# Patient Record
Sex: Female | Born: 1990 | Race: Black or African American | Hispanic: No | Marital: Single | Smoking: Never smoker
Health system: Southern US, Community
[De-identification: ages and names within clinical notes are randomized; demographics above are authoritative.]

## PROBLEM LIST (undated history)

## (undated) ENCOUNTER — Inpatient Hospital Stay: Payer: Self-pay

## (undated) DIAGNOSIS — D696 Thrombocytopenia, unspecified: Secondary | ICD-10-CM

## (undated) DIAGNOSIS — R87629 Unspecified abnormal cytological findings in specimens from vagina: Secondary | ICD-10-CM

## (undated) HISTORY — DX: Unspecified abnormal cytological findings in specimens from vagina: R87.629

## (undated) HISTORY — PX: OTHER SURGICAL HISTORY: SHX169

---
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
## 2012-12-13 ENCOUNTER — Observation Stay: Payer: Self-pay

## 2012-12-22 ENCOUNTER — Observation Stay: Payer: Self-pay | Admitting: Obstetrics and Gynecology

## 2012-12-25 ENCOUNTER — Inpatient Hospital Stay: Payer: Self-pay

## 2012-12-25 LAB — CBC WITH DIFFERENTIAL/PLATELET
Basophil #: 0.1 10*3/uL (ref 0.0–0.1)
Eosinophil #: 0 10*3/uL (ref 0.0–0.7)
Eosinophil %: 0.3 %
HGB: 13 g/dL (ref 12.0–16.0)
Lymphocyte %: 17.9 %
MCV: 87 fL (ref 80–100)
Monocyte #: 0.7 x10 3/mm (ref 0.2–0.9)
Monocyte %: 7.7 %
Neutrophil #: 7.2 10*3/uL — ABNORMAL HIGH (ref 1.4–6.5)
RBC: 4.48 10*6/uL (ref 3.80–5.20)
WBC: 9.8 10*3/uL (ref 3.6–11.0)

## 2012-12-26 LAB — COMPREHENSIVE METABOLIC PANEL
Anion Gap: 7 (ref 7–16)
BUN: 8 mg/dL (ref 7–18)
Chloride: 110 mmol/L — ABNORMAL HIGH (ref 98–107)
Co2: 22 mmol/L (ref 21–32)
Glucose: 68 mg/dL (ref 65–99)
Osmolality: 274 (ref 275–301)
SGPT (ALT): 13 U/L (ref 12–78)
Total Protein: 5.3 g/dL — ABNORMAL LOW (ref 6.4–8.2)

## 2012-12-26 LAB — CBC
HCT: 33.6 % — ABNORMAL LOW (ref 35.0–47.0)
HGB: 11.2 g/dL — ABNORMAL LOW (ref 12.0–16.0)
MCH: 28.8 pg (ref 26.0–34.0)
MCHC: 33.3 g/dL (ref 32.0–36.0)
MCV: 86 fL (ref 80–100)
Platelet: 55 10*3/uL — ABNORMAL LOW (ref 150–440)
RDW: 13.3 % (ref 11.5–14.5)
WBC: 13.6 10*3/uL — ABNORMAL HIGH (ref 3.6–11.0)

## 2012-12-26 LAB — LACTATE DEHYDROGENASE: LDH: 194 U/L (ref 81–246)

## 2012-12-27 LAB — CBC
HCT: 33.3 % — ABNORMAL LOW (ref 35.0–47.0)
RBC: 3.78 10*6/uL — ABNORMAL LOW (ref 3.80–5.20)
WBC: 11.5 10*3/uL — ABNORMAL HIGH (ref 3.6–11.0)

## 2013-06-24 ENCOUNTER — Ambulatory Visit: Payer: Self-pay | Admitting: Hematology and Oncology

## 2013-08-24 ENCOUNTER — Emergency Department: Payer: Self-pay | Admitting: Emergency Medicine

## 2013-08-24 LAB — URINALYSIS, COMPLETE
BILIRUBIN, UR: NEGATIVE
Blood: NEGATIVE
Glucose,UR: NEGATIVE mg/dL (ref 0–75)
Ketone: NEGATIVE
Nitrite: NEGATIVE
PROTEIN: NEGATIVE
Ph: 6 (ref 4.5–8.0)
RBC,UR: 2 /HPF (ref 0–5)
Specific Gravity: 1.029 (ref 1.003–1.030)
Squamous Epithelial: 7
WBC UR: 34 /HPF (ref 0–5)

## 2013-08-24 LAB — CBC WITH DIFFERENTIAL/PLATELET
Basophil #: 0 10*3/uL (ref 0.0–0.1)
Basophil %: 0.6 %
EOS PCT: 1.2 %
Eosinophil #: 0.1 10*3/uL (ref 0.0–0.7)
HCT: 37.5 % (ref 35.0–47.0)
HGB: 11.9 g/dL — AB (ref 12.0–16.0)
LYMPHS ABS: 3.2 10*3/uL (ref 1.0–3.6)
Lymphocyte %: 43.3 %
MCH: 26.6 pg (ref 26.0–34.0)
MCHC: 31.6 g/dL — ABNORMAL LOW (ref 32.0–36.0)
MCV: 84 fL (ref 80–100)
Monocyte #: 0.6 x10 3/mm (ref 0.2–0.9)
Monocyte %: 8.7 %
Neutrophil #: 3.4 10*3/uL (ref 1.4–6.5)
Neutrophil %: 46.2 %
PLATELETS: 144 10*3/uL — AB (ref 150–440)
RBC: 4.46 10*6/uL (ref 3.80–5.20)
RDW: 14.1 % (ref 11.5–14.5)
WBC: 7.4 10*3/uL (ref 3.6–11.0)

## 2013-08-24 LAB — COMPREHENSIVE METABOLIC PANEL
ALBUMIN: 4.1 g/dL (ref 3.4–5.0)
ANION GAP: 6 — AB (ref 7–16)
Alkaline Phosphatase: 74 U/L
BILIRUBIN TOTAL: 0.3 mg/dL (ref 0.2–1.0)
BUN: 13 mg/dL (ref 7–18)
CO2: 27 mmol/L (ref 21–32)
Calcium, Total: 9.1 mg/dL (ref 8.5–10.1)
Chloride: 106 mmol/L (ref 98–107)
Creatinine: 0.77 mg/dL (ref 0.60–1.30)
EGFR (African American): >60
Glucose: 100 mg/dL — ABNORMAL HIGH (ref 65–99)
Osmolality: 278 (ref 275–301)
POTASSIUM: 3.9 mmol/L (ref 3.5–5.1)
SGOT(AST): 13 U/L — ABNORMAL LOW (ref 15–37)
SGPT (ALT): 13 U/L (ref 12–78)
Sodium: 139 mmol/L (ref 136–145)
Total Protein: 8.1 g/dL (ref 6.4–8.2)

## 2013-08-24 LAB — LIPASE, BLOOD: LIPASE: 263 U/L (ref 73–393)

## 2013-08-26 LAB — URINE CULTURE

## 2014-04-08 NOTE — L&D Delivery Note (Signed)
Delivery Note At 11:09 PM a viable female was delivered via Vaginal, Spontaneous Delivery (Presentation: Left Occiput Anterior).  APGAR: 8,9 ; weight - pending.   Placenta status: Intact, Spontaneous.  Cord: 3 vessels with the following complications: none apparent .  Cord pH: n/a  Anesthesia: Pudendal block (10cc 1% lidocaine) Episiotomy: None Lacerations: None Suture Repair: n/a Est. Blood Loss (mL): 50cc  Mom to postpartum.  Baby to Couplet care / Skin to Skin.  Patient progressed from 5 to complete fairly quickly and had a short second stage with 2 pushes.  No lacerations.  Baby was delivered and placed on mom's chest.  Cord was kept patent until pulseless and then doubly clamped and cut by FOB.  Placenta was delivered intact, spontaneously and with minimal blood loss.  Methergine PO was given with attempts to minimize post partum bleeding, in addition to pitocin bolus (patient had been on pitocin without a break for >12 hours).  Baby did have a ball of tissue attached by a thin bridge of skin to the distal digit (pinky) of left hand.  No other abnormalities noted on brief and gross exam.  After cord was cut baby was placed on mom for skin-to-skin and we sang happy birthday.  Ward, Chelsea C 02/07/2015, 11:27 PM

## 2014-05-09 ENCOUNTER — Emergency Department: Payer: Self-pay | Admitting: Emergency Medicine

## 2014-05-09 LAB — CBC
HCT: 38.5 % (ref 35.0–47.0)
HGB: 12.3 g/dL (ref 12.0–16.0)
MCH: 27 pg (ref 26.0–34.0)
MCHC: 32 g/dL (ref 32.0–36.0)
MCV: 84 fL (ref 80–100)
Platelet: 162 10*3/uL (ref 150–440)
RBC: 4.56 10*6/uL (ref 3.80–5.20)
RDW: 13.4 % (ref 11.5–14.5)
WBC: 8.8 10*3/uL (ref 3.6–11.0)

## 2014-05-09 LAB — URINALYSIS, COMPLETE
Bacteria: NONE SEEN
Bilirubin,UR: NEGATIVE
Blood: NEGATIVE
Glucose,UR: NEGATIVE mg/dL (ref 0–75)
Ketone: NEGATIVE
NITRITE: NEGATIVE
PH: 5 (ref 4.5–8.0)
PROTEIN: NEGATIVE
RBC,UR: 1 /HPF (ref 0–5)
Specific Gravity: 1.029 (ref 1.003–1.030)
Squamous Epithelial: 4
WBC UR: 4 /HPF (ref 0–5)

## 2014-05-09 LAB — LIPASE, BLOOD: Lipase: 139 U/L (ref 73–393)

## 2014-05-09 LAB — COMPREHENSIVE METABOLIC PANEL
Albumin: 3.7 g/dL (ref 3.4–5.0)
Alkaline Phosphatase: 73 U/L (ref 46–116)
Anion Gap: 7 (ref 7–16)
BUN: 13 mg/dL (ref 7–18)
Bilirubin,Total: 0.4 mg/dL (ref 0.2–1.0)
Calcium, Total: 9.1 mg/dL (ref 8.5–10.1)
Chloride: 107 mmol/L (ref 98–107)
Co2: 27 mmol/L (ref 21–32)
Creatinine: 0.85 mg/dL (ref 0.60–1.30)
EGFR (African American): >60
EGFR (Non-African Amer.): >60
Glucose: 83 mg/dL (ref 65–99)
Osmolality: 281 (ref 275–301)
Potassium: 4 mmol/L (ref 3.5–5.1)
SGOT(AST): 21 U/L (ref 15–37)
SGPT (ALT): 15 U/L (ref 14–63)
Sodium: 141 mmol/L (ref 136–145)
Total Protein: 7.9 g/dL (ref 6.4–8.2)

## 2014-07-10 ENCOUNTER — Emergency Department
Admit: 2014-07-10 | Disposition: A | Discharge status: Home / Self Care | Payer: Self-pay | Admitting: Emergency Medicine

## 2014-07-25 DIAGNOSIS — E663 Overweight: Secondary | ICD-10-CM | POA: Insufficient documentation

## 2014-07-26 LAB — OB RESULTS CONSOLE RPR: RPR: NONREACTIVE

## 2014-07-26 LAB — OB RESULTS CONSOLE ABO/RH: RH Type: POSITIVE

## 2014-07-26 LAB — OB RESULTS CONSOLE HEPATITIS B SURFACE ANTIGEN: Hepatitis B Surface Ag: NEGATIVE

## 2014-08-07 ENCOUNTER — Encounter: Payer: Self-pay | Admitting: Emergency Medicine

## 2014-08-07 ENCOUNTER — Emergency Department: Payer: No Typology Code available for payment source

## 2014-08-07 ENCOUNTER — Emergency Department
Admission: EM | Admit: 2014-08-07 | Discharge: 2014-08-07 | Disposition: A | Discharge status: Home / Self Care | Payer: No Typology Code available for payment source | Attending: Student | Admitting: Student

## 2014-08-07 DIAGNOSIS — R102 Pelvic and perineal pain: Secondary | ICD-10-CM | POA: Insufficient documentation

## 2014-08-07 DIAGNOSIS — Z3A11 11 weeks gestation of pregnancy: Secondary | ICD-10-CM | POA: Insufficient documentation

## 2014-08-07 DIAGNOSIS — Z79899 Other long term (current) drug therapy: Secondary | ICD-10-CM | POA: Insufficient documentation

## 2014-08-07 DIAGNOSIS — O9989 Other specified diseases and conditions complicating pregnancy, childbirth and the puerperium: Secondary | ICD-10-CM | POA: Diagnosis not present

## 2014-08-07 DIAGNOSIS — N949 Unspecified condition associated with female genital organs and menstrual cycle: Secondary | ICD-10-CM

## 2014-08-07 DIAGNOSIS — O26899 Other specified pregnancy related conditions, unspecified trimester: Secondary | ICD-10-CM

## 2014-08-07 LAB — URINALYSIS COMPLETE WITH MICROSCOPIC (ARMC ONLY)
BACTERIA UA: NONE SEEN
Bilirubin Urine: NEGATIVE
Glucose, UA: NEGATIVE mg/dL
Hgb urine dipstick: NEGATIVE
Ketones, ur: NEGATIVE mg/dL
NITRITE: NEGATIVE
Protein, ur: NEGATIVE mg/dL
SPECIFIC GRAVITY, URINE: 1.031 — AB (ref 1.005–1.030)
pH: 5 (ref 5.0–8.0)

## 2014-08-07 LAB — COMPREHENSIVE METABOLIC PANEL
ALBUMIN: 3.8 g/dL (ref 3.5–5.0)
ALT: 9 U/L — ABNORMAL LOW (ref 14–54)
AST: 17 U/L (ref 15–41)
Alkaline Phosphatase: 47 U/L (ref 38–126)
Anion gap: 9 (ref 5–15)
BUN: 12 mg/dL (ref 6–20)
CALCIUM: 9.4 mg/dL (ref 8.9–10.3)
CO2: 23 mmol/L (ref 22–32)
CREATININE: 0.57 mg/dL (ref 0.44–1.00)
Chloride: 103 mmol/L (ref 101–111)
Glucose, Bld: 78 mg/dL (ref 65–99)
Potassium: 3.9 mmol/L (ref 3.5–5.1)
Sodium: 135 mmol/L (ref 135–145)
Total Bilirubin: 0.6 mg/dL (ref 0.3–1.2)
Total Protein: 7.5 g/dL (ref 6.5–8.1)

## 2014-08-07 LAB — CBC WITH DIFFERENTIAL/PLATELET
BASOS ABS: 0 10*3/uL (ref 0–0.1)
Basophils Relative: 0 %
EOS ABS: 0 10*3/uL (ref 0–0.7)
Eosinophils Relative: 0 %
HEMATOCRIT: 35.3 % (ref 35.0–47.0)
HEMOGLOBIN: 11.3 g/dL — AB (ref 12.0–16.0)
LYMPHS ABS: 1.7 10*3/uL (ref 1.0–3.6)
MCH: 26.7 pg (ref 26.0–34.0)
MCHC: 32.1 g/dL (ref 32.0–36.0)
MCV: 83.2 fL (ref 80.0–100.0)
Monocytes Absolute: 0.6 10*3/uL (ref 0.2–0.9)
NEUTROS ABS: 6.4 10*3/uL (ref 1.4–6.5)
Neutrophils Relative %: 74 %
Platelets: 109 10*3/uL — ABNORMAL LOW (ref 150–440)
RBC: 4.24 MIL/uL (ref 3.80–5.20)
RDW: 13.8 % (ref 11.5–14.5)
WBC: 8.7 10*3/uL (ref 3.6–11.0)

## 2014-08-07 LAB — HCG, QUANTITATIVE, PREGNANCY: hCG, Beta Chain, Quant, S: 125316 m[IU]/mL — ABNORMAL HIGH (ref <5)

## 2014-08-07 MED ORDER — ACETAMINOPHEN 500 MG PO TABS
1000.0000 mg | ORAL_TABLET | Freq: Once | ORAL | Status: AC
  Administered 2014-08-07: 1000 mg via ORAL

## 2014-08-07 MED ORDER — ACETAMINOPHEN 500 MG PO TABS: 500.0000 mg | ORAL_TABLET | Freq: Four times a day (QID) | ORAL | Status: AC | PRN

## 2014-08-07 MED ORDER — ACETAMINOPHEN 500 MG PO TABS
ORAL_TABLET | ORAL | Status: AC
  Filled 2014-08-07: qty 2

## 2014-08-07 NOTE — ED Provider Notes (Signed)
[facility] Emergency Department Provider Note    ____________________________________________  Time seen: ----------------------------------------- 10:21 PM on 08/07/2014 -----------------------------------------    I have reviewed the triage vital signs and the nursing notes.   HISTORY  Chief Complaint Abdominal Pain       HPI Vanessa Francis is a 24 y.o. female with no chronic medical problems, G2 P1 at approximately 13 weeks estimated gestational age who presents for evaluation one week of constant right lower abdominal pain, worse with standing, relieved with sitting or lying down. Gradual onset. At maximum severity it is moderate, currently mild to moderate. She describes it as "sore". No nausea, vomiting, diarrhea, fevers, chills, abnormal vaginal bleeding or vaginal discharge. She has not tried any medications for pain.     No past medical history on file.  There are no active problems to display for this patient.   No past surgical history on file.  Current Outpatient Rx  Name  Route  Sig  Dispense  Refill  . Prenatal Vit-Fe Fumarate-FA (PRENATAL MULTIVITAMIN) TABS tablet   Oral   Take 1 tablet by mouth daily at 12 noon.           Allergies Review of patient's allergies indicates no known allergies.  Family History  Problem Relation Age of Onset  . Hypertension Mother     Social History History  Substance Use Topics  . Smoking status: Never Smoker   . Smokeless tobacco: Not on file  . Alcohol Use: No    Review of Systems  Constitutional: Negative for fever. Eyes: Negative for visual changes. ENT: Negative for sore throat. Cardiovascular: Negative for chest pain. Respiratory: Negative for shortness of breath. Gastrointestinal: Positive for abdominal pain, negative for vomiting and diarrhea. Genitourinary: Negative for dysuria. Musculoskeletal: Negative for back pain. Skin: Negative for rash. Neurological:  Negative for headaches, focal weakness or numbness.   10-point ROS otherwise negative.  ____________________________________________   PHYSICAL EXAM:  VITAL SIGNS: ED Triage Vitals  Enc Vitals Group     BP 08/07/14 1744 124/67 mmHg     Pulse Rate 08/07/14 1744 88     Resp 08/07/14 2035 16     Temp 08/07/14 1744 98.8 F (37.1 C)     Temp Source 08/07/14 1744 Oral     SpO2 08/07/14 1744 100 %     Weight 08/07/14 1744 185 lb (83.915 kg)     Height 08/07/14 1744 5\' 6"  (1.676 m)     Head Cir --      Peak Flow --      Pain Score 08/07/14 1745 8     Pain Loc --      Pain Edu? --      Excl. in GC? --      Constitutional: Alert and oriented. Well appearing and in no distress. Eyes: Conjunctivae are normal. PERRL. Normal extraocular movements. ENT   Head: Normocephalic and atraumatic.   Nose: No congestion/rhinnorhea.   Mouth/Throat: Mucous membranes are moist.   Neck: No stridor. Hematological/Lymphatic/Immunilogical: No cervical lymphadenopathy. Cardiovascular: Normal rate, regular rhythm. Normal and symmetric distal pulses are present in all extremities. No murmurs, rubs, or gallops. Respiratory: Normal respiratory effort without tachypnea nor retractions. Breath sounds are clear and equal bilaterally. No wheezes/rales/rhonchi. Gastrointestinal: Soft and nontender. No distention. No abdominal bruits. There is no CVA tenderness. Genitourinary: Deferred Musculoskeletal: Nontender with normal range of motion in all extremities. No joint effusions.  No lower extremity tenderness nor edema. Neurologic:  Normal speech and language. No  gross focal neurologic deficits are appreciated. Speech is normal. No gait instability. Skin:  Skin is warm, dry and intact. No rash noted. Psychiatric: Mood and affect are normal. Speech and behavior are normal. Patient exhibits appropriate insight and judgment.    ____________________________________________     RADIOLOGY  Ultrasound first trimester: Single live uterine pregnancy at 11 weeks 1 day estimated gestational age.   ____________________________________________   PROCEDURES  Procedure(s) performed: None  Critical Care performed: No  ____________________________________________   INITIAL IMPRESSION / ASSESSMENT AND PLAN / ED COURSE  Pertinent labs & imaging results that were available during my care of the patient were reviewed by me and considered in my medical decision making (see chart for details).  Vanessa Francis is a 24 y.o. female with no chronic medical problems, G2 P1 at approximately 13 weeks estimated gestational age who presents for evaluation one week of constant right lower abdominal pain, worse with standing, relieved with sitting or lying down. Suspect round ligament pain. She has no abdominal tenderness. No leukocytosis, no associative GI complaints and I doubt appendicitis. She appears well, is sitting up in bed eating a chicken sandwich. Labs reassuring. UA contaminated and no urinary complaints we'll send culture/wait to treat. Return precautions discussed. DC home with OB follow-up.  ____________________________________________   FINAL CLINICAL IMPRESSION(S) / ED DIAGNOSES  Final diagnoses:  Round ligament pain     Vanessa Doss, MD 08/07/14 2313

## 2014-08-07 NOTE — Discharge Instructions (Signed)
Uterus in the emergency department for right abdominal pain which may be due to stretch on the round ligaments. Take Tylenol as prescribed for pain and follow-up with your OB/GYN as soon as possible. Return immediately to the emergency department for severe or worsening pain, nausea, vomiting, diarrhea fevers, heavy vaginal bleeding or for any other concerns.

## 2014-08-07 NOTE — ED Notes (Signed)
Lab requisition sent for urine culture

## 2014-08-09 LAB — URINE CULTURE: Special Requests: NORMAL

## 2014-08-16 NOTE — H&P (Signed)
L&D Evaluation:  History Expanded:  HPI 24 yo G1 at 6470w0d gestational age by LMP consistent with early ultrasound. Pregnancy complicated by chlamydia diagnosed early in pregnancy and treated, thrombocytopenia on 6/18 (102k), and staph saprophyticis UTI, treated in this pregnancy. She presents for an NST from the health department and delivery planning.  She notes no contractions, no leakage of fluid, and no vaginal bleeding.  She notes positive fetal movement. TDaP given 09/21/12   Gravida 1   Blood Type (Maternal) B positive   Group B Strep Results Maternal (Result >5wks must be treated as unknown) positive   Maternal HIV Negative   Maternal Syphilis Ab Nonreactive   Maternal Varicella Immune   Rubella Results (Maternal) immune   [facility]   Patient's Medical History No Chronic Illness   Patient's Surgical History none   Medications Pre Natal Vitamins   Allergies NKDA   Social History none   Family History Non-Contributory   ROS:  ROS All systems were reviewed.  HEENT, CNS, GI, GU, Respiratory, CV, Renal and Musculoskeletal systems were found to be normal.   Exam:  Vital Signs T 98.3, P 88, RR 16, BP 112/68   General no apparent distress   Abdomen gravid, non-tender   Pelvic 1 cm per RN   Impression:  Impression reactive NST, 1) Intrauterine pregnancy at 41 weeks, 2) late term pregnancy   Plan:  Plan EFM/NST   Comments 1) NST reactive 2) induction schedule for Friday 9/19 at 8pm (cervical ripening with induction on 9/20)   Follow Up Appointment need to schedule   Electronic Signatures: Conard NovakJackson, Stephen D (MD)  (Signed 16-Sep-14 19:30)  Authored: L&D Evaluation   Last Updated: 16-Sep-14 19:30 by Conard NovakJackson, Stephen D (MD)

## 2014-08-16 NOTE — H&P (Signed)
L&D Evaluation:  History Expanded:  HPI 24 yo G1 at 2233w3d gestational age w estimated date of confinement 12/15/12, consistent with early ultrasound. Pregnancy complicated by chlamydia diagnosed early in pregnancy and treated, thrombocytopenia on 6/18 (102k), and staph saprophyticis UTI, treated in this pregnancy. She presents with painful contractions  since 8am, no leakage of fluid, and no vaginal bleeding.  She notes positive fetal movement. TDaP given 09/21/12.   Gravida 1   Blood Type (Maternal) B positive   Group B Strep Results Maternal (Result >5wks must be treated as unknown) positive   Maternal HIV Negative   Maternal Syphilis Ab Nonreactive   Maternal Varicella Immune   Rubella Results (Maternal) immune   [facility]   Patient's Medical History No Chronic Illness   Patient's Surgical History none   Medications Pre Natal Vitamins   Allergies NKDA   Social History none   Family History Non-Contributory   ROS:  ROS All systems were reviewed.  HEENT, CNS, GI, GU, Respiratory, CV, Renal and Musculoskeletal systems were found to be normal.   Exam:  Vital Signs BP 12/70 and 140/90 w pain   General no apparent distress   Mental Status clear   Chest clear   Heart normal sinus rhythm   Abdomen gravid, tender with contractions   Estimated Fetal Weight Average for gestational age   Back no CVAT   Edema no edema   Pelvic no external lesions, 5-6/90/BBOW/VTX   Mebranes Intact   FHT normal rate with no decels   Ucx regular   Skin dry   Impression:  Impression active labor, reactive NST   Plan:  Plan EFM/NST, monitor contractions and for cervical change, antibiotics for GBBS prophylaxis, fluids   Comments Epidural desired, discussed. Cervix dilated quickly so far, from 3 to 6 in 30 min.  Will try to get ABX in for Group B Beta Strep..   Follow Up Appointment need to schedule   Electronic Signatures: Letitia LibraHarris, Leialoha Hanna Paul (MD)  (Signed  19-Sep-14 10:45)  Authored: L&D Evaluation   Last Updated: 19-Sep-14 10:45 by Letitia LibraHarris, Koriana Stepien Paul (MD)

## 2014-12-14 ENCOUNTER — Observation Stay
Admission: EM | Admit: 2014-12-14 | Discharge: 2014-12-14 | Disposition: A | Discharge status: Home / Self Care | Payer: Medicaid Other | Attending: Obstetrics and Gynecology | Admitting: Obstetrics and Gynecology

## 2014-12-14 DIAGNOSIS — O26893 Other specified pregnancy related conditions, third trimester: Principal | ICD-10-CM | POA: Insufficient documentation

## 2014-12-14 DIAGNOSIS — R110 Nausea: Secondary | ICD-10-CM | POA: Diagnosis present

## 2014-12-14 DIAGNOSIS — Z3A31 31 weeks gestation of pregnancy: Secondary | ICD-10-CM | POA: Diagnosis not present

## 2014-12-14 LAB — URINALYSIS COMPLETE WITH MICROSCOPIC (ARMC ONLY)
Bacteria, UA: NONE SEEN
Bilirubin Urine: NEGATIVE
Glucose, UA: NEGATIVE mg/dL
Hgb urine dipstick: NEGATIVE
Ketones, ur: NEGATIVE mg/dL
Nitrite: NEGATIVE
PH: 7 (ref 5.0–8.0)
Protein, ur: NEGATIVE mg/dL
Specific Gravity, Urine: 1.021 (ref 1.005–1.030)

## 2014-12-14 MED ORDER — PROMETHAZINE HCL 25 MG PO TABS
25.0000 mg | ORAL_TABLET | Freq: Four times a day (QID) | ORAL | Status: DC | PRN
  Administered 2014-12-14: 25 mg via ORAL

## 2014-12-14 MED ORDER — PROMETHAZINE HCL 25 MG PO TABS
ORAL_TABLET | ORAL | Status: AC
  Administered 2014-12-14: 25 mg via ORAL
  Filled 2014-12-14: qty 1

## 2014-12-14 NOTE — OB Triage Note (Signed)
Ms. Vanessa Francis arrives with c/o nausea and one episode of vomiting this AM after taking her PNV. States she woke up feeling "Faint and weak" but did not pass out.  Denies bleeding, LOF, diarrhea. She gets her prenatal care at [facility], did not call them with this concern. Last prenatal visit 8/29. Next appt 9/12. Reports eating regular diet yesterday without any issue. She reports that she still feels weak and nauseated, but no further vomiting.

## 2014-12-14 NOTE — Discharge Instructions (Signed)

## 2014-12-14 NOTE — OB Triage Provider Note (Signed)
TRIAGE VISIT with NST   [name] is a 23 y.o. G3P1001. She is at [redacted]w[redacted]d gestation.  Indication: Woke up with nausea and vomiting x1. No dysuria, back pain, diarrhea or constipation. Ate normal dinner last night. No fever sx. 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  Care at [facility].  S: Resting comfortably. no CTX, no VB. Active fetal movement.  O:  BP 108/71 mmHg  Pulse 79  Temp(Src) 97.7 F (36.5 C) (Oral)  Resp 16  LMP 05/08/2014 Results for orders placed or performed during the hospital encounter of 12/14/14 (from the past 48 hour(s))  Urinalysis complete, with microscopic [facility] only)   Collection Time: 12/14/14 12:35 PM  Result Value Ref Range   Color, Urine AMBER (A) YELLOW   APPearance HAZY (A) CLEAR   Glucose, UA NEGATIVE NEGATIVE mg/dL   Bilirubin Urine NEGATIVE NEGATIVE   Ketones, ur NEGATIVE NEGATIVE mg/dL   Specific Gravity, Urine 1.021 1.005 - 1.030   Hgb urine dipstick NEGATIVE NEGATIVE   pH 7.0 5.0 - 8.0   Protein, ur NEGATIVE NEGATIVE mg/dL   Nitrite NEGATIVE NEGATIVE   Leukocytes, UA TRACE (A) NEGATIVE   RBC / HPF 0-5 0 - 5 RBC/hpf   WBC, UA 0-5 0 - 5 WBC/hpf   Bacteria, UA NONE SEEN NONE SEEN   Squamous Epithelial / LPF 6-30 (A) NONE SEEN   Mucous PRESENT      Gen: NAD, AAOx3      Abd: FNTTP      Ext: Non-tender, Nonedmeatous    FHT: 140s, mod variability, +accels, no decels TOCO: quiet SVE:  deferred  NST: Reactive. Please see reading above.  A/P:  24 y.o. G3P1001 [redacted]w[redacted]d with vomiting x1, feeling puny.   Labor: not present.   Vomiting: Pt tolerating po intake in triage. Responded well to antiemetics x1 po. Pt mildly dehydrated by ua, which was not concerning for infection. Conservative measures recommended. Po hydration, rest, small bland meals until feels improved.   Fetal Wellbeing: Reassuring Cat 1 tracing.  D/c home stable, precautions reviewed, follow-up as scheduled.

## 2014-12-14 NOTE — Progress Notes (Signed)
Pt reports improvement in symptoms, states nausea is gone, feeling better. Agrees with plan to go home, pt provided crackers and ginger ale, pt states she feels ready to eat something.

## 2015-01-16 ENCOUNTER — Ambulatory Visit: Payer: No Typology Code available for payment source

## 2015-01-19 ENCOUNTER — Ambulatory Visit: Payer: No Typology Code available for payment source

## 2015-01-23 ENCOUNTER — Ambulatory Visit
Admission: RE | Admit: 2015-01-23 | Discharge: 2015-01-23 | Disposition: A | Discharge status: Home / Self Care | Payer: Medicaid Other | Source: Ambulatory Visit | Attending: Maternal & Fetal Medicine | Admitting: Maternal & Fetal Medicine

## 2015-01-23 VITALS — BP 115/80 | HR 89 | Temp 98.3°F | Ht 65.0 in | Wt 192.0 lb

## 2015-01-23 DIAGNOSIS — O99119 Other diseases of the blood and blood-forming organs and certain disorders involving the immune mechanism complicating pregnancy, unspecified trimester: Secondary | ICD-10-CM

## 2015-01-23 DIAGNOSIS — D696 Thrombocytopenia, unspecified: Secondary | ICD-10-CM

## 2015-01-23 DIAGNOSIS — O26893 Other specified pregnancy related conditions, third trimester: Secondary | ICD-10-CM | POA: Insufficient documentation

## 2015-01-23 DIAGNOSIS — Z3A35 35 weeks gestation of pregnancy: Secondary | ICD-10-CM | POA: Diagnosis not present

## 2015-01-23 HISTORY — DX: Other diseases of the blood and blood-forming organs and certain disorders involving the immune mechanism complicating pregnancy, unspecified trimester: D69.6

## 2015-01-23 HISTORY — DX: Thrombocytopenia, unspecified: O99.119

## 2015-01-23 HISTORY — DX: Thrombocytopenia, unspecified: D69.6

## 2015-01-23 LAB — COMPREHENSIVE METABOLIC PANEL WITH GFR
ALT: 11 U/L — ABNORMAL LOW (ref 14–54)
AST: 18 U/L (ref 15–41)
Albumin: 3.1 g/dL — ABNORMAL LOW (ref 3.5–5.0)
Alkaline Phosphatase: 90 U/L (ref 38–126)
Anion gap: 6 (ref 5–15)
BUN: 7 mg/dL (ref 6–20)
CO2: 19 mmol/L — ABNORMAL LOW (ref 22–32)
Calcium: 8.9 mg/dL (ref 8.9–10.3)
Chloride: 107 mmol/L (ref 101–111)
Creatinine, Ser: 0.5 mg/dL (ref 0.44–1.00)
GFR calc Af Amer: >60 mL/min
GFR calc non Af Amer: >60 mL/min
Glucose, Bld: 70 mg/dL (ref 65–99)
Potassium: 3.8 mmol/L (ref 3.5–5.1)
Sodium: 132 mmol/L — ABNORMAL LOW (ref 135–145)
Total Bilirubin: 0.8 mg/dL (ref 0.3–1.2)
Total Protein: 6.8 g/dL (ref 6.5–8.1)

## 2015-01-23 LAB — RETICULOCYTES
RBC.: 3.96 MIL/uL (ref 3.80–5.20)
Retic Count, Absolute: 47.5 10*3/uL (ref 19.0–183.0)
Retic Ct Pct: 1.2 % (ref 0.4–3.1)

## 2015-01-23 LAB — CBC
HCT: 34.7 % — ABNORMAL LOW (ref 35.0–47.0)
Hemoglobin: 11.7 g/dL — ABNORMAL LOW (ref 12.0–16.0)
MCH: 29.6 pg (ref 26.0–34.0)
MCHC: 33.8 g/dL (ref 32.0–36.0)
MCV: 87.6 fL (ref 80.0–100.0)
Platelets: 66 K/uL — ABNORMAL LOW (ref 150–440)
RBC: 3.96 MIL/uL (ref 3.80–5.20)
RDW: 13.3 % (ref 11.5–14.5)
WBC: 7.9 K/uL (ref 3.6–11.0)

## 2015-01-23 LAB — PROTEIN / CREATININE RATIO, URINE
CREATININE, URINE: 156 mg/dL
Protein Creatinine Ratio: 0.12 mg/mg{Cre} (ref 0.00–0.15)
TOTAL PROTEIN, URINE: 19 mg/dL

## 2015-01-23 LAB — FIBRINOGEN: FIBRINOGEN: 437 mg/dL (ref 210–470)

## 2015-01-23 LAB — TSH: TSH: 0.915 u[IU]/mL (ref 0.350–4.500)

## 2015-01-23 LAB — APTT: aPTT: 29 seconds (ref 24–36)

## 2015-01-23 LAB — PROTIME-INR
INR: 1.04
Prothrombin Time: 13.8 s (ref 11.4–15.0)

## 2015-01-23 NOTE — Addendum Note (Signed)
Encounter addended by: Lady DeutscherAndra Kay Shippy, MD on: 01/23/2015  4:57 PM<BR>     Documentation filed: Notes Section

## 2015-01-23 NOTE — Progress Notes (Addendum)
[facility] Maternal-Fetal Medicine Consultation   Chief Complaint: Thrombocytopenia  HPI: Ms. Vanessa Francis is a 24 y.o. G3P1001 at [redacted]w[redacted]d by 138w1dSKoreaerformed [facility]  due to thrombocytopenia.  The patient was also found to have thrombocytopenia in her last pregnancy. The platelets were stable above 100,000 during the pregnancy. On admission for delivery they were they were 67,000. 1 day postpartum they were 55,000. She was apparently referred to hematology, based on notes in the chart, but she never met with a hematologist and has had no evaluation for thrombocytopenia.  Subsequently on 08/24/2013 her platelet count was 144,000. 05/09/2014 her platelet count was 162,000, the only time her platelet count has been in the normal range. At the beginning of this pregnancy on 08/07/2014 her platelet count was 109,000.  On labs ordered by the health department, her platelet count was 98,000 on 12/05/2014 and 90,000 on 01/03/2015.  She denies any history of bruising or bleeding other than an occasional, small nosebleed.   Obstetric History:  Obstetric History   G2   P1   T1   P0   A0   TAB0   SAB0   E0   M0   L2     # Outcome Date GA Lbr Len/2nd Weight Sex Delivery Anes PTL Lv  2 Current           1 Term 12/25/12 4175w3d lb 14 oz (3.118 kg) F Vag-Spont  N Y      Gynecologic History:   Last Pap smear normal.    Past Medical History: Patient  has a past medical history of Thrombocytopenia ([facility]  Past Surgical History: She  has no past surgical history on file.   Medications: PN vitamins   Allergies: Patient has No Known Allergies.   Social History: Patient  reports that she has never smoked. She does not have any smokeless tobacco history on file. She reports that she does not drink alcohol.   Family History: family history includes Heart disease in her maternal grandmother; Hypertension in her mother; Stroke in her paternal grandmother.    Review of Systems A full 12 point review of systems was negative or as noted in the History of Present Illness.    Physical Exam: BP 115/80 mmHg  Pulse 89  Temp(Src) 98.3 F (36.8 C)  Ht 5' 5"  (1.651 m)  Wt 192 lb (87.091 kg)  BMI 31.95 kg/m2  LMP 05/08/2014  Neck - no thyromegaly Lungs - clear Heart - RRR Abdomen - no liver or spleen enlargement; gravid Pelvic - deferred Skin - no bruises or petechiae  US Koreaday - cephalic, EFW [id number] 15%95%KDTOFI = 9.3 cm  Asessement: [age] gravida 2 para 1001 at 35w6w2dtation with pre-existing thrombocytopenia, aggravated by pregnancy.  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  Recommendations: 1.  I counseled the patient about the role of platelets and coagulation. 2.  I counseled the patient about the possibilities of of either an inherited or acquired platelet disorder, with immune thrombocytopenia purpura being the most common acquired disorder. 3.  I obtained the following laboratory studies to look for other causes of thrombocytopenia or for causes of secondary ITP: --  CBC, reticulocyte count, CMP, hepatitis C antibody screen, lupus anticoagulant, anticardiolipin antibody, anti-beta 2 glycoprotein 1, TSH, PT, PTT, fibrinogen and PC ratio. 4. I would recommend transferring the patient's care to an OBGYN practice.  [facility]  and would be comfortable there. 5. The patient should have an anesthesiology consult prior to delivery 6. If her platelets remain below 100,000, I would recommend starting prednisone 10 mg po qd and repeating her platelets in one week.  I would adjust the dose upward weekly by 10 mg/kg.  When her platelets are > 100,000 and she is > 37 weeks, I would recommend induction of labor.   7.  In the interim, I would recommend weekly testing with NST/AFI or BPP. 8. She was counseled that whether she has an inherited or acquired thrombocytopenia, the fetus may be at risk of  thrombocytopenia.  I would recommend avoiding invasive procedures if at all possible at the time of delivery. If operative vaginal delivery is unavoidable, I would recommend forceps over vacuum extraction.  9. I would recommend obtaining an extra purple top tube of cord blood at the time of delivery and sending it for a platelet count. 10. The pediatric team should be notified of the possibility of neonatal thrombocytopenia. 11.  Postpartum, she should NOT receive NSAIDs - aspirin, naproxen, toradol, ibuprofen.  She can receive acetaminophen and oxycodone. 12.  I will plan to see her on 02/13/2015 for final delivery planning, unless she has delivered in the interim. 13.  Postpartum, she should have follow-up with Dr. Alyssa Francis in hematology here at [facility].     Total time spent with the patient was 40 minutes with greater than 50% spent in counseling and coordination of care.  We appreciate this consult and will be happy to be involved in the ongoing care of Vanessa Francis in anyway her providers desire.  Erasmo Score, MD [facility]

## 2015-01-25 LAB — OB RESULTS CONSOLE GBS: GBS: NEGATIVE

## 2015-01-26 LAB — BETA-2-GLYCOPROTEIN I ABS, IGG/M/A
Beta-2 Glyco I IgG: <9 GPI IgG units (ref 0–20)
Beta-2-Glycoprotein I IgA: <9 GPI IgA units (ref 0–25)
Beta-2-Glycoprotein I IgM: <9 GPI IgM units (ref 0–32)

## 2015-01-26 LAB — LUPUS ANTICOAGULANT PANEL
DRVVT: 35.3 s (ref 0.0–55.1)
PTT LA: 38 s (ref 0.0–50.0)

## 2015-01-26 LAB — CARDIOLIPIN ANTIBODIES, IGG, IGM, IGA
Anticardiolipin IgA: <9 APL U/mL (ref 0–11)
Anticardiolipin IgM: <9 MPL U/mL (ref 0–12)

## 2015-01-26 LAB — HEPATITIS C ANTIBODY: HCV Ab: <0.1 s/co ratio (ref 0.0–0.9)

## 2015-01-30 ENCOUNTER — Ambulatory Visit
Admission: RE | Admit: 2015-01-30 | Discharge: 2015-01-30 | Disposition: A | Discharge status: Home / Self Care | Payer: Medicaid Other | Source: Ambulatory Visit | Attending: Obstetrics and Gynecology | Admitting: Obstetrics and Gynecology

## 2015-01-30 ENCOUNTER — Telehealth: Payer: Self-pay

## 2015-01-30 VITALS — BP 119/65 | HR 79 | Temp 97.6°F | Wt 192.0 lb

## 2015-01-30 DIAGNOSIS — O99119 Other diseases of the blood and blood-forming organs and certain disorders involving the immune mechanism complicating pregnancy, unspecified trimester: Principal | ICD-10-CM

## 2015-01-30 DIAGNOSIS — D696 Thrombocytopenia, unspecified: Secondary | ICD-10-CM | POA: Diagnosis not present

## 2015-01-30 DIAGNOSIS — Z3A36 36 weeks gestation of pregnancy: Secondary | ICD-10-CM | POA: Insufficient documentation

## 2015-01-30 LAB — CBC
HCT: 36 % (ref 35.0–47.0)
Hemoglobin: 11.7 g/dL — ABNORMAL LOW (ref 12.0–16.0)
MCH: 28.4 pg (ref 26.0–34.0)
MCHC: 32.5 g/dL (ref 32.0–36.0)
MCV: 87.2 fL (ref 80.0–100.0)
PLATELETS: 64 10*3/uL — AB (ref 150–440)
RBC: 4.13 MIL/uL (ref 3.80–5.20)
RDW: 13.9 % (ref 11.5–14.5)
WBC: 8.7 10*3/uL (ref 3.6–11.0)

## 2015-01-30 MED ORDER — PREDNISONE 1 MG PO TABS: 20.0000 mg | ORAL_TABLET | Freq: Every day | ORAL | Status: DC

## 2015-01-30 NOTE — Progress Notes (Signed)
Most recent labs from 10/17 demonstrate platelets at 66,000.  Discussed with Dr. Fayrene FearingJames - will start 20mg  prednisone daily (script provided) and repeat plts weekly.  Patient [facility] and a referral has also been placed to Dr. Orlie DakinFinnegan (Hematology).    Continue daily NST/AFI.

## 2015-01-30 NOTE — Telephone Encounter (Signed)
Prescription for prednisone 20 mg po qd, quantity #30, [facility] prescribed by Dr. Kirby FunkSarah Ellestad.

## 2015-01-30 NOTE — Telephone Encounter (Signed)
Appointment Scheduled with Dr[facility] Orlie [facility]inFinnegan @ [facility] on Wednesday, 01-31-15 @ 1330.

## 2015-02-01 ENCOUNTER — Inpatient Hospital Stay: Payer: Medicaid Other | Attending: Oncology | Admitting: Oncology

## 2015-02-06 ENCOUNTER — Inpatient Hospital Stay
Admission: RE | Admit: 2015-02-06 | Discharge: 2015-02-09 | DRG: 775 | Disposition: A | Discharge status: Home / Self Care | Payer: Medicaid Other | Attending: Obstetrics and Gynecology | Admitting: Obstetrics and Gynecology

## 2015-02-06 ENCOUNTER — Ambulatory Visit (HOSPITAL_BASED_OUTPATIENT_CLINIC_OR_DEPARTMENT_OTHER)
Admission: RE | Admit: 2015-02-06 | Discharge: 2015-02-06 | Disposition: A | Discharge status: Home / Self Care | Payer: Medicaid Other | Source: Ambulatory Visit | Attending: Obstetrics and Gynecology | Admitting: Obstetrics and Gynecology

## 2015-02-06 ENCOUNTER — Other Ambulatory Visit: Payer: Self-pay | Admitting: Obstetrics and Gynecology

## 2015-02-06 ENCOUNTER — Ambulatory Visit
Admission: RE | Admit: 2015-02-06 | Discharge: 2015-02-06 | Disposition: A | Discharge status: Home / Self Care | Payer: Medicaid Other | Source: Ambulatory Visit | Attending: Obstetrics and Gynecology | Admitting: Obstetrics and Gynecology

## 2015-02-06 VITALS — BP 113/62 | HR 86 | Temp 98.1°F | Wt 195.0 lb

## 2015-02-06 DIAGNOSIS — O4103X0 Oligohydramnios, third trimester, not applicable or unspecified: Secondary | ICD-10-CM | POA: Diagnosis present

## 2015-02-06 DIAGNOSIS — O4100X0 Oligohydramnios, unspecified trimester, not applicable or unspecified: Secondary | ICD-10-CM

## 2015-02-06 DIAGNOSIS — D696 Thrombocytopenia, unspecified: Secondary | ICD-10-CM | POA: Diagnosis present

## 2015-02-06 DIAGNOSIS — Z3A37 37 weeks gestation of pregnancy: Secondary | ICD-10-CM

## 2015-02-06 DIAGNOSIS — O134 Gestational [pregnancy-induced] hypertension without significant proteinuria, complicating childbirth: Secondary | ICD-10-CM | POA: Diagnosis present

## 2015-02-06 DIAGNOSIS — D6959 Other secondary thrombocytopenia: Secondary | ICD-10-CM | POA: Diagnosis present

## 2015-02-06 DIAGNOSIS — O9902 Anemia complicating childbirth: Secondary | ICD-10-CM | POA: Diagnosis present

## 2015-02-06 DIAGNOSIS — Z8249 Family history of ischemic heart disease and other diseases of the circulatory system: Secondary | ICD-10-CM

## 2015-02-06 DIAGNOSIS — O9912 Other diseases of the blood and blood-forming organs and certain disorders involving the immune mechanism complicating childbirth: Secondary | ICD-10-CM | POA: Diagnosis present

## 2015-02-06 DIAGNOSIS — O99119 Other diseases of the blood and blood-forming organs and certain disorders involving the immune mechanism complicating pregnancy, unspecified trimester: Principal | ICD-10-CM

## 2015-02-06 DIAGNOSIS — O99113 Other diseases of the blood and blood-forming organs and certain disorders involving the immune mechanism complicating pregnancy, third trimester: Secondary | ICD-10-CM

## 2015-02-06 DIAGNOSIS — Z823 Family history of stroke: Secondary | ICD-10-CM | POA: Diagnosis not present

## 2015-02-06 LAB — CBC
HEMATOCRIT: 35.5 % (ref 35.0–47.0)
HEMOGLOBIN: 12 g/dL (ref 12.0–16.0)
MCH: 30.2 pg (ref 26.0–34.0)
MCHC: 33.9 g/dL (ref 32.0–36.0)
MCV: 89.2 fL (ref 80.0–100.0)
Platelets: 62 10*3/uL — ABNORMAL LOW (ref 150–440)
RBC: 3.98 MIL/uL (ref 3.80–5.20)
RDW: 13.4 % (ref 11.5–14.5)
WBC: 7.5 10*3/uL (ref 3.6–11.0)

## 2015-02-06 LAB — CHLAMYDIA/NGC RT PCR (ARMC ONLY)
Chlamydia Tr: NOT DETECTED
N gonorrhoeae: NOT DETECTED

## 2015-02-06 LAB — PREPARE RBC (CROSSMATCH)

## 2015-02-06 LAB — ABO/RH: ABO/RH(D): B POS

## 2015-02-06 MED ORDER — LACTATED RINGERS IV SOLN
INTRAVENOUS | Status: DC
  Administered 2015-02-06 – 2015-02-07 (×4): 125 mL/h via INTRAVENOUS

## 2015-02-06 MED ORDER — TERBUTALINE SULFATE 1 MG/ML IJ SOLN: 0.2500 mg | Freq: Once | INTRAMUSCULAR | Status: DC | PRN

## 2015-02-06 MED ORDER — OXYTOCIN 40 UNITS IN LACTATED RINGERS INFUSION - SIMPLE MED
62.5000 mL/h | INTRAVENOUS | Status: DC
  Filled 2015-02-06: qty 1000

## 2015-02-06 MED ORDER — LIDOCAINE HCL (PF) 1 % IJ SOLN
30.0000 mL | INTRAMUSCULAR | Status: DC | PRN
  Filled 2015-02-06: qty 30

## 2015-02-06 MED ORDER — ONDANSETRON HCL 4 MG/2ML IJ SOLN: 4.0000 mg | Freq: Four times a day (QID) | INTRAMUSCULAR | Status: DC | PRN

## 2015-02-06 MED ORDER — DINOPROSTONE 10 MG VA INST
10.0000 mg | VAGINAL_INSERT | Freq: Once | VAGINAL | Status: AC
  Administered 2015-02-06: 10 mg via VAGINAL
  Filled 2015-02-06 (×2): qty 1

## 2015-02-06 MED ORDER — LACTATED RINGERS IV SOLN
500.0000 mL | INTRAVENOUS | Status: DC | PRN
  Administered 2015-02-08: 1000 mL via INTRAVENOUS

## 2015-02-06 MED ORDER — ZOLPIDEM TARTRATE 5 MG PO TABS
5.0000 mg | ORAL_TABLET | Freq: Every evening | ORAL | Status: DC | PRN
  Administered 2015-02-07: 5 mg via ORAL

## 2015-02-06 MED ORDER — OXYTOCIN BOLUS FROM INFUSION: 500.0000 mL | INTRAVENOUS | Status: DC

## 2015-02-06 NOTE — H&P (Signed)
OB History & Physical   History of Present Illness:  Chief Complaint:  "They told me I did not have a lot of fluid around my baby." HPI:  Vanessa Francis is a 24 y.o. G49P1001 female with EDC=02/23/2015 at [redacted]w[redacted]d dated by a 18wk5day ultrasound.  Her pregnancy has been complicated by thrombocytopenia.  She presents to L&D for induction of labor due to oligohydraminos. She was seen in [facility] today and was noted to have an AFI=2.6cm.  Her platelet count today, a week after starting Prednisone 60 mgm daily,  was 62K. She had a normal plt count in April 2016 of 174,000, on 8/29 it was 98,000 and on 9/27 it was 90,000.  On October 17 the platelet count was 66, 000 and on 24 October it was 64,000 at which time prednisone was started. A work up for antiphospholipid antibodies was negative.  ROS: She reports leaking urine, and is not sure she is having LOF from the vagina. Baby active. Denies contractions or VB. Past OB history:  NSVD 2 years ago delivering a 6#14oz infant at 41 weeks. Pregnancy/labor also c/b thrombocytopenia.   Prenatal care site: Prenatal care at [facility] in consultation with [facility] has also been remarkable for  Chlamydia and anemia. Chlamydia TOC negative x2. TDAP given 12/05/2014. Wants to breast feed. Depo for contraception      Maternal Medical History:   Past Medical History  Diagnosis Date  . Thrombocytopenia (HCC)     No past surgical history on file.  No Known Allergies  Prior to Admission medications   Medication Sig Start Date End Date Taking? Authorizing Provider  acetaminophen (TYLENOL) 500 MG tablet Take 1 tablet (500 mg total) by mouth every 6 (six) hours as needed for moderate pain. 08/07/14 08/07/15  Gayla Doss, MD  ferrous fumarate (HEMOCYTE - 106 MG FE) 325 (106 FE) MG TABS tablet Take 1 tablet by mouth.    Historical Provider, MD  predniSONE (DELTASONE) 1 MG tablet Take 20 tablets (20 mg total) by mouth daily with breakfast. 01/30/15   Kirby Funk, MD  Prenatal Vit-Fe Fumarate-FA (PRENATAL MULTIVITAMIN) TABS tablet Take 1 tablet by mouth daily at 12 noon.    Historical Provider, MD   Prednisone is 10 mgm tablets and she is taking 16 mgm/day      Social History: She  reports that she has never smoked. She has never used smokeless tobacco. She reports that she does not drink alcohol or use illicit drugs.  Family History: family history includes Heart disease in her maternal grandmother; Hypertension in her mother; Stroke in her paternal grandmother.   Review of Systems: Negative x 10 systems reviewed except as noted in the HPI.      Physical Exam:  Vital Signs: BP 126/69 mmHg  Pulse 92  Temp(Src) 98.3 F (36.8 C) (Oral)  Resp 18  Ht  (1.651 m)  Wt 195 lb (88.451 kg)  BMI 32.45 kg/m2  LMP 05/08/2014 General: no acute distress.  HEENT: normocephalic, atraumatic Heart: regular rate & rhythm.  No murmurs Lungs: clear to auscultation bilaterally Abdomen: soft, gravid, non-tender;  EFW: 5#8oz Pelvic:   External: Normal external female genitalia/ urine odor present  Cervix: Dilation: 1 / Effacement (%): Thick /-1  ROM: negative pooling, ferning, Nitrazine Extremities: non-tender, symmetric, no edema bilaterally.  DTRs: +1 Neurologic: Alert & oriented x 3.    Pertinent Results:  Prenatal Labs: Blood type/Rh B positive  Antibody screen negative  Rubella Varicella Immune Immune  RPR negative  HBsAg negative  HIV negative  GC negative  Chlamydia Positive 4//20 with negative TOC x2  Genetic screening declined  1 hour GTT 122  3 hour GTT neg  GBS negative on 10/21   Results for orders placed or performed during the hospital encounter of 02/06/15 (from the past 24 hour(s))  Type and screen [facility]     Status: None (Preliminary result)   Collection Time: 02/06/15 11:48 AM  Result Value Ref Range   ABO/RH(D) B POS    Antibody Screen NEG    Sample Expiration 02/09/2015    Unit  Number [id number]    Blood Component Type RED CELLS,LR    Unit division 00    Status of Unit ALLOCATED    Transfusion Status OK TO TRANSFUSE    Crossmatch Result Compatible    Unit Number [id number]    Blood Component Type RED CELLS,LR    Unit division 00    Status of Unit ALLOCATED    Transfusion Status OK TO TRANSFUSE    Crossmatch Result Compatible   Prepare RBC     Status: None   Collection Time: 02/06/15 11:48 AM  Result Value Ref Range   Order Confirmation ORDER PROCESSED BY BLOOD BANK   ABO/Rh     Status: None   Collection Time: 02/06/15 11:49 AM  Result Value Ref Range   ABO/RH(D) B POS   Prepare Pheresed Platelets     Status: None (Preliminary result)   Collection Time: 02/06/15 12:44 PM  Result Value Ref Range   Unit Number [id number]    Blood Component Type PLTP LR3 PAS    Unit division 00    Status of Unit ALLOCATED    Transfusion Status OK TO TRANSFUSE    CBC Latest Ref Rng 02/06/2015 01/30/2015 01/23/2015  WBC 3.6 - 11.0 K/uL 7.5 8.7 7.9  Hemoglobin 12.0 - 16.0 g/dL 52.812.0 11.7(L) 11.7(L)  Hematocrit 35.0 - 47.0 % 35.5 36.0 34.7(L)  Platelets 150 - 440 K/uL 62(L) 64(L) 66(L)    Baseline FHR: 130 with accelerations to 160s, moderate variability Toco: q4-7 min apart   Assessment:  Vanessa Francis is a 24 y.o. 852P1001 female at 699w2d with oligo hydraminos and thrombocytopenia.  Plan:  1. Admit to Labor & Delivery - notified attending  2. TXC for 2 units PRBCs and 1 unit platelets in case of PPH. As per DP recommendations, avoid invasive procedures in case baby has thrombocytopenia and if operative vaginal delivery is unavoidable, forceps preferred. Obtain an extra purple top tube of cord blood to check baby's platelet count. Notify neonatologist of the possibility of neonatal thrombocytopenia. Avoid NSAIDS and ASA postpartum. Have patient follow up postpartum with hematology.  3. GBS negative.   4. Consents obtained. 5. Plan Cervidil today in light of  unripe cervix. It was inserted around 1330 after explaining POM to patient. She is aware of risks of hyperstimulation, fetal intolerance and failed induction/ Cesarean delivery.   Vanessa Francis  02/06/2015 1:57 PM

## 2015-02-06 NOTE — Progress Notes (Signed)
NST FHR baseline 130's Mod variability 15x15 accels present Mild variables Rare contractions (tightening per patient) Reactive NST  Ob limited:  Cephalic, AFI 2.6 cm Findings were discussed.  Will send to L&D for evaluation of ROB and consideration of delivery.  Note that patient was started on prednisone for thrombocytopenia; repeat cbc from today is pending.

## 2015-02-07 ENCOUNTER — Encounter: Payer: Self-pay | Admitting: *Deleted

## 2015-02-07 LAB — CBC
HCT: 35.2 % (ref 35.0–47.0)
HEMATOCRIT: 35.4 % (ref 35.0–47.0)
HEMOGLOBIN: 11.6 g/dL — AB (ref 12.0–16.0)
Hemoglobin: 11.9 g/dL — ABNORMAL LOW (ref 12.0–16.0)
MCH: 29 pg (ref 26.0–34.0)
MCH: 28.4 pg (ref 26.0–34.0)
MCHC: 33.6 g/dL (ref 32.0–36.0)
MCHC: 32.9 g/dL (ref 32.0–36.0)
MCV: 86.2 fL (ref 80.0–100.0)
MCV: 86.3 fL (ref 80.0–100.0)
PLATELETS: 67 10*3/uL — AB (ref 150–440)
Platelets: 58 10*3/uL — ABNORMAL LOW (ref 150–440)
RBC: 4.11 MIL/uL (ref 3.80–5.20)
RBC: 4.08 MIL/uL (ref 3.80–5.20)
RDW: 13.6 % (ref 11.5–14.5)
RDW: 13.5 % (ref 11.5–14.5)
WBC: 9.8 10*3/uL (ref 3.6–11.0)
WBC: 9 10*3/uL (ref 3.6–11.0)

## 2015-02-07 LAB — RPR: RPR: NONREACTIVE

## 2015-02-07 MED ORDER — MEDROXYPROGESTERONE ACETATE 150 MG/ML IM SUSP
150.0000 mg | Freq: Once | INTRAMUSCULAR | Status: AC
  Administered 2015-02-09: 150 mg via INTRAMUSCULAR
  Filled 2015-02-07 (×2): qty 1

## 2015-02-07 MED ORDER — OXYTOCIN 10 UNIT/ML IJ SOLN
INTRAMUSCULAR | Status: AC
  Filled 2015-02-07: qty 2

## 2015-02-07 MED ORDER — METHYLERGONOVINE MALEATE 0.2 MG PO TABS
0.2000 mg | ORAL_TABLET | Freq: Four times a day (QID) | ORAL | Status: DC
  Administered 2015-02-07 – 2015-02-09 (×5): 0.2 mg via ORAL
  Filled 2015-02-07 (×7): qty 1

## 2015-02-07 MED ORDER — TERBUTALINE SULFATE 1 MG/ML IJ SOLN: 0.2500 mg | Freq: Once | INTRAMUSCULAR | Status: DC | PRN

## 2015-02-07 MED ORDER — OXYTOCIN 40 UNITS IN LACTATED RINGERS INFUSION - SIMPLE MED
1.0000 m[IU]/min | INTRAVENOUS | Status: DC
Start: 2015-02-07 — End: 2015-02-08
  Administered 2015-02-07: 2 m[IU]/min via INTRAVENOUS

## 2015-02-07 MED ORDER — BUTORPHANOL TARTRATE 1 MG/ML IJ SOLN
INTRAMUSCULAR | Status: AC
  Administered 2015-02-07: 2 mg via INTRAVENOUS
  Filled 2015-02-07: qty 2

## 2015-02-07 MED ORDER — AMMONIA AROMATIC IN INHA
RESPIRATORY_TRACT | Status: AC
  Filled 2015-02-07: qty 10

## 2015-02-07 MED ORDER — ZOLPIDEM TARTRATE 5 MG PO TABS
ORAL_TABLET | ORAL | Status: AC
  Administered 2015-02-07: 5 mg via ORAL
  Filled 2015-02-07: qty 1

## 2015-02-07 MED ORDER — BUTORPHANOL TARTRATE 1 MG/ML IJ SOLN
2.0000 mg | INTRAMUSCULAR | Status: DC | PRN
  Administered 2015-02-07: 2 mg via INTRAVENOUS

## 2015-02-07 MED ORDER — SODIUM CHLORIDE 0.9 % IJ SOLN
INTRAMUSCULAR | Status: AC
  Administered 2015-02-07: 3 mL
  Filled 2015-02-07: qty 6

## 2015-02-07 NOTE — Progress Notes (Signed)
L&D Note  02/07/2015 - 7:52 AM  24 y.o. G2P1001 9049w3d by 18wk5d ultrasound. Pregnancy complicated by thrombocytopenia  Patient Active Problem List   Diagnosis Date Noted  . Thrombocytopenia complicating pregnancy, third trimester 02/06/2015  . Thrombocytopenia affecting pregnancy (HCC) 01/23/2015  . Nausea 12/14/2014    Ms. Vanessa Francis is admitted for IOL for oligo hydraminos   Subjective:  Had a lot of contractions when Cervidil in place. Cervidil removed around 0130 this AM. Slept with the help of Ambien  Objective:   Filed Vitals:   02/06/15 1610 02/06/15 1710 02/06/15 1810 02/07/15 0725  BP: 125/71 129/67 139/73 123/86  Pulse: 74 79 86 87  Temp:  98 F (36.7 C)  98 F (36.7 C)  TempSrc:  Oral  Oral  Resp:  18 18 16   Height:      Weight:        Current Vital Signs 24h Vital Sign Ranges  T 98 F (36.7 C) Temp  Avg: 98.1 F (36.7 C)  Min: 97.9 F (36.6 C)  Max: 98.3 F (36.8 C)  BP 123/86 mmHg BP  Min: 113/62  Max: 139/73  HR 87 Pulse  Avg: 83.7  Min: 74  Max: 92  RR 16 Resp  Avg: 18  Min: 16  Max: 20  SaO2     No Data Recorded       24 Hour I/O Current Shift I/O  Time Ins Outs 10/31 0701 - 11/01 0700 In: 2372.9 [I.V.:2372.9] Out: -      ZOX:WRUEAVWUFHR:baseline 125-130 with accelerations to 150-180, moderate variability Toco: irregular, mild Gen: sleepy SVE: 3/60%/-1  Labs:   Recent Labs Lab 02/06/15 0840  WBC 7.5  HGB 12.0  HCT 35.5  PLT 62*    Medications Current Facility-Administered Medications  Medication Dose Route Frequency Provider Last Rate Last Dose  . lactated ringers infusion 500-1,000 mL  500-1,000 mL Intravenous PRN Farrel Connersolleen Adriyanna Christians, CNM      . lactated ringers infusion   Intravenous Continuous Farrel Connersolleen Fatemah Pourciau, CNM   Stopped at 02/07/15 [phone]  . lidocaine (PF) (XYLOCAINE) 1 % injection 30 mL  30 mL Subcutaneous PRN Farrel Connersolleen Tamar Lipscomb, CNM      . ondansetron (ZOFRAN) injection 4 mg  4 mg Intravenous Q6H PRN Farrel Connersolleen Briaunna Grindstaff, CNM      .  oxytocin (PITOCIN) IV BOLUS FROM BAG  500 mL Intravenous Continuous Farrel Connersolleen Odessie Polzin, CNM      . oxytocin (PITOCIN) IV infusion 40 units in LR 1000 mL  62.5 mL/hr Intravenous Continuous Farrel Connersolleen Audris Speaker, CNM      . terbutaline (BRETHINE) injection 0.25 mg  0.25 mg Subcutaneous Once PRN Farrel Connersolleen Malayja Freund, CNM      . zolpidem (AMBIEN) tablet 5 mg  5 mg Oral QHS PRN Farrel Connersolleen Dontee Jaso, CNM   5 mg at 02/07/15 0147    Assessment & Plan:   IUP at 37.3 weeks undergoing IOL for oligo-cervix ripened nicely with Cervidil. FHR tracing Cat1  Can shower this AM. Plan Pitocin this AM. Analgesia: IV analgesia CBC repeat this AM.  Farrel ConnersGUTIERREZ, Solita Macadam, CNM [facility]

## 2015-02-07 NOTE — Progress Notes (Signed)
Intrapartum progress note:  S: patient uncomfortable but breathing well through contractions.  Has rectal pressure. O: BP 132/68 mmHg  Pulse 82  Temp(Src) 98 F (36.7 C) (Oral)  Resp 20   FHT:  135 mod + acces + early decels Toco: q 2-3 pit @ 20 SVE:  6/90/-1 digitally ruptured for minimal clear, blood tinged fluid  A/P: 24yo G2P1001 @ 37.3 with IOL for oligohydramnios at term with thrombocytopenia.    1. IUP: category 1 2. IOL: continue active management with pitocin. Did not use hook to AROM due to plts, however there was some blood in fluid - not much. 3. Thrombocytopenia: recheck CBC now (last @ 0730); again refrain from operative delivery, intrauterine monitors, epidural.  4u PRBC and 1u Platelets on hold @ blood bank.  PPH meds @ bedside. 4. Dispo: continue inpatient management.  ----- Ranae Plumberhelsea Eliyohu Class, MD Attending Obstetrician and Gynecologist [facility]

## 2015-02-07 NOTE — Discharge Summary (Signed)
Obstetrical Discharge Summary  Patient Name: Vanessa Francis DOB: Aug 10, 1990 MRN: [id number]  Date of Admission: 02/06/2015 Date of Discharge: 02/09/2015  Primary OB: [facility]  Gestational Age at Delivery: 3249w3d   Antepartum complications: thrombocytopenia, oligohydramnios, history of chlamydia, anemia Admitting Diagnosis: oligohydramnios at term Secondary Diagnosis: thrombocytopenia, anemia Augmentation: AROM, Pitocin and cervidil Complications: None Intrapartum complications/course: patient had cervical ripening overnight and was started on pitocin in the morning, after a slow progress during latent phase, she became active and progressed quickly.  AROM with digit (no hook) was performed and patient received pudendal block to reduce rectal pain/urge to push.  She was then complete and pushed twice with delivery of viable female infant. Minimal blood loss.  Methergine given PO post delivery. Date of Delivery: 02/07/15 Delivered By: Leeroy Bockhelsea Ward Delivery Type: spontaneous vaginal delivery Anesthesia: pudendal block Placenta: sponatneous Laceration: none Episiotomy: none Newborn Data: Live born female  Birth Weight: 3000gm APGAR: 8/9   Discharge Physical Exam:  BP 118/95 mmHg  Pulse 73  Temp(Src) 97.9 F (36.6 C) (Oral)  Resp 18  Ht 5\' 5"  (1.651 m)  Wt 195 lb (88.451 kg)  BMI 32.45 kg/m2  SpO2 99%  LMP 05/08/2014  Breastfeeding? Unknown  General: NAD CV: RRR Pulm: CTABL, nl effort ABD: s/nd/nt, fundus firm and below the umbilicus Lochia: minimal Brachial 1+ b/l   Recent Labs Lab 02/07/15 0738 02/07/15 2019 02/08/15 0548 02/09/15 0548  WBC 9.8 9.0 10.8  --   HGB 11.9* 11.6* 12.5  --   HCT 35.4 35.2 36.6  --   PLT 67* 58* 70* PLATELET CLUMPING, SUGGEST RECOLLECTION OF SAMPLE IN CITRATE TUBE.    Post partum course: some mild range BPs but negative s/s of pre-eclampsia Postpartum Procedures: none Disposition: stable, discharge to home. Baby Feeding: breastmilk Baby  Disposition: home with mom  Rh Immune globulin given: no Rubella vaccine given: no Tdap vaccine given in AP or PP setting: antepartum Flu vaccine given in AP or PP setting: declined  Contraception: depo provera  Prenatal Labs:  Blood type/Rh B positive  Antibody screen negative  Rubella Varicella Immune Immune  RPR negative  HBsAg negative  HIV negative  GC negative  Chlamydia Positive 4//20 with negative TOC x2  Genetic screening declined  1 hour GTT 122  3 hour GTT neg  GBS negative on 10/21          Plan:  Vanessa CliffLashana Chipley was discharged to home in good condition. [facility] with Dr. Elesa MassedWard in 1 week for BP check   Discharge Medications:   Medication List    STOP taking these medications        predniSONE 1 MG tablet  Commonly known as:  DELTASONE      TAKE these medications        acetaminophen 500 MG tablet  Commonly known as:  TYLENOL  Take 1 tablet (500 mg total) by mouth every 6 (six) hours as needed for moderate pain.     acetaminophen 325 MG tablet  Commonly known as:  TYLENOL  Take 2 tablets (650 mg total) by mouth every 6 (six) hours as needed (for pain scale < 4).     docusate sodium 100 MG capsule  Commonly known as:  COLACE  Take 1 capsule (100 mg total) by mouth 2 (two) times daily as needed for mild constipation.     ferrous fumarate 325 (106 FE) MG Tabs tablet  Commonly known as:  HEMOCYTE - 106 mg FE  Take 1  tablet by mouth.     prenatal multivitamin Tabs tablet  Take 1 tablet by mouth daily at 12 noon.        Signed: Cornelia Copa MD [facility]  Pager: [phone]

## 2015-02-08 DIAGNOSIS — O4100X0 Oligohydramnios, unspecified trimester, not applicable or unspecified: Secondary | ICD-10-CM

## 2015-02-08 LAB — TYPE AND SCREEN
ABO/RH(D): B POS
Antibody Screen: NEGATIVE
UNIT DIVISION: 0
UNIT DIVISION: 0
UNIT DIVISION: 0
Unit division: 0

## 2015-02-08 LAB — PREPARE PLATELET PHERESIS: Unit division: 0

## 2015-02-08 LAB — CBC
HEMATOCRIT: 36.6 % (ref 35.0–47.0)
Hemoglobin: 12.5 g/dL (ref 12.0–16.0)
MCH: 29.9 pg (ref 26.0–34.0)
MCHC: 34.2 g/dL (ref 32.0–36.0)
MCV: 87.3 fL (ref 80.0–100.0)
Platelets: 70 10*3/uL — ABNORMAL LOW (ref 150–440)
RBC: 4.19 MIL/uL (ref 3.80–5.20)
RDW: 13.6 % (ref 11.5–14.5)
WBC: 10.8 10*3/uL (ref 3.6–11.0)

## 2015-02-08 MED ORDER — TETANUS-DIPHTH-ACELL PERTUSSIS 5-2.5-18.5 LF-MCG/0.5 IM SUSP: 0.5000 mL | Freq: Once | INTRAMUSCULAR | Status: DC

## 2015-02-08 MED ORDER — SODIUM CHLORIDE 0.9 % IJ SOLN
3.0000 mL | Freq: Four times a day (QID) | INTRAMUSCULAR | Status: DC
  Administered 2015-02-08 – 2015-02-09 (×2): 3 mL via INTRAVENOUS

## 2015-02-08 MED ORDER — PRENATAL MULTIVITAMIN CH
1.0000 | ORAL_TABLET | Freq: Every day | ORAL | Status: DC
  Administered 2015-02-09 (×2): 1 via ORAL
  Filled 2015-02-08: qty 1

## 2015-02-08 MED ORDER — DOCUSATE SODIUM 100 MG PO CAPS
100.0000 mg | ORAL_CAPSULE | Freq: Two times a day (BID) | ORAL | Status: DC
  Administered 2015-02-08 – 2015-02-09 (×3): 100 mg via ORAL
  Filled 2015-02-08 (×3): qty 1

## 2015-02-08 MED ORDER — OXYCODONE-ACETAMINOPHEN 5-325 MG PO TABS
1.0000 | ORAL_TABLET | ORAL | Status: DC | PRN
  Administered 2015-02-08: 1 via ORAL
  Filled 2015-02-08: qty 1

## 2015-02-08 MED ORDER — ONDANSETRON HCL 4 MG PO TABS: 4.0000 mg | ORAL_TABLET | ORAL | Status: DC | PRN

## 2015-02-08 MED ORDER — SIMETHICONE 80 MG PO CHEW: 80.0000 mg | CHEWABLE_TABLET | ORAL | Status: DC | PRN

## 2015-02-08 MED ORDER — DIBUCAINE 1 % RE OINT: 1.0000 "application " | TOPICAL_OINTMENT | RECTAL | Status: DC | PRN

## 2015-02-08 MED ORDER — WITCH HAZEL-GLYCERIN EX PADS: 1.0000 "application " | MEDICATED_PAD | CUTANEOUS | Status: DC | PRN

## 2015-02-08 MED ORDER — OXYTOCIN 40 UNITS IN LACTATED RINGERS INFUSION - SIMPLE MED
62.5000 mL/h | INTRAVENOUS | Status: DC | PRN
  Filled 2015-02-08: qty 1000

## 2015-02-08 MED ORDER — ONDANSETRON HCL 4 MG/2ML IJ SOLN: 4.0000 mg | INTRAMUSCULAR | Status: DC | PRN

## 2015-02-08 MED ORDER — IBUPROFEN 600 MG PO TABS
600.0000 mg | ORAL_TABLET | Freq: Four times a day (QID) | ORAL | Status: DC
  Administered 2015-02-08 – 2015-02-09 (×4): 600 mg via ORAL
  Filled 2015-02-08 (×4): qty 1

## 2015-02-08 MED ORDER — DIPHENHYDRAMINE HCL 25 MG PO CAPS: 25.0000 mg | ORAL_CAPSULE | Freq: Four times a day (QID) | ORAL | Status: DC | PRN

## 2015-02-08 MED ORDER — ACETAMINOPHEN 325 MG PO TABS: 650.0000 mg | ORAL_TABLET | ORAL | Status: DC | PRN

## 2015-02-08 MED ORDER — BENZOCAINE-MENTHOL 20-0.5 % EX AERO: 1.0000 "application " | INHALATION_SPRAY | CUTANEOUS | Status: DC | PRN

## 2015-02-08 MED ORDER — LANOLIN HYDROUS EX OINT: TOPICAL_OINTMENT | CUTANEOUS | Status: DC | PRN

## 2015-02-08 NOTE — Progress Notes (Signed)
Patient ID: Vanessa CliffLashana Arganbright, female   DOB: 1990-06-22, 24 y.o.   MRN: [id number] Obstetric Postpartum Daily Progress Note Subjective:  24 y.o. N8G9562G2P2002 postpartum day #1 status post vaginal delivery.  She is ambulating, is tolerating po, is voiding spontaneously.  Her pain is well controlled on PO pain medications. Her lochia is equal to menses.   Medications SCHEDULED MEDICATIONS  . docusate sodium  100 mg Oral BID  . ibuprofen  600 mg Oral 4 times per day  . medroxyPROGESTERone  150 mg Intramuscular Once  . methylergonovine  0.2 mg Oral QID  . prenatal multivitamin  1 tablet Oral Q1200  . [START ON 02/09/2015] Tdap  0.5 mL Intramuscular Once    MEDICATION INFUSIONS  . lactated ringers Stopped (02/08/15 0100)  . oxytocin 40 units in LR 1000 mL    . oxytocin 40 units in LR 1000 mL    . oxytocin 40 units in LR 1000 mL 41.667 milli-units/min (02/07/15 2338)  . oxytocin 40 units in LR 1000 mL 62.5 mL/hr (02/08/15 0900)    PRN MEDICATIONS  acetaminophen, benzocaine-Menthol, butorphanol, witch hazel-glycerin **AND** dibucaine, diphenhydrAMINE, lactated ringers, lanolin, lidocaine (PF), ondansetron **OR** ondansetron (ZOFRAN) IV, oxyCODONE-acetaminophen, oxytocin 40 units in LR 1000 mL, simethicone, terbutaline, zolpidem    Objective:   Filed Vitals:   02/08/15 0004 02/08/15 0200 02/08/15 0221 02/08/15 0810  BP: 117/59 132/71 137/83 141/89  Pulse: 78 82 86 72  Temp:   98.3 F (36.8 C) 98 F (36.7 C)  TempSrc:   Oral Oral  Resp:   18 20  Height:      Weight:      SpO2:   98% 99%    Current Vital Signs 24h Vital Sign Ranges  T 98 F (36.7 C) Temp  Avg: 98 F (36.7 C)  Min: 97.5 F (36.4 C)  Max: 98.3 F (36.8 C)  BP (!) 141/89 mmHg BP  Min: 107/57  Max: 154/76  HR 72 Pulse  Avg: 82.8  Min: 72  Max: 98  RR 20 Resp  Avg: 19  Min: 18  Max: 20  SaO2 99 %   SpO2  Avg: 98.5 %  Min: 98 %  Max: 99 %       24 Hour I/O Current Shift I/O  Time Ins Outs 11/01 0701 - 11/02 0700 In:  2841.6 [I.V.:2841.6] Out: 50     General: NAD Pulmonary: no increased work of breathing Abdomen: non-distended, non-tender, fundus firm at level of umbilicus Extremities: no edema, no erythema, no tenderness  Labs:   Recent Labs Lab 02/07/15 0738 02/07/15 2019 02/08/15 0548  WBC 9.8 9.0 10.8  HGB 11.9* 11.6* 12.5  HCT 35.4 35.2 36.6  PLT 67* 58* 70*     Assessment:   24 y.o. [id number] postpartum day # 1 s/p SVD afer IOL for oligohydramnios. She also has thrombocytopenia with somewhat improved platelets today.  Plan:   1) Thrombocytopenia - likely gestational. Will recheck platelets tomorrow.  2) B POS / Rubella Immune/ Varicella Immune  3) TDAP status given 8/29.  Received flu vaccine in pregnancy, also.  4) breast /Contraception = Depo-Provera  5) Disposition: home tomorrow  Conard NovakStephen D. Jackson, MD 02/08/2015 10:29 AM

## 2015-02-09 LAB — PLATELET COUNT

## 2015-02-09 MED ORDER — DOCUSATE SODIUM 100 MG PO CAPS: 100.0000 mg | ORAL_CAPSULE | Freq: Two times a day (BID) | ORAL | Status: DC | PRN

## 2015-02-09 MED ORDER — ACETAMINOPHEN 325 MG PO TABS: 650.0000 mg | ORAL_TABLET | Freq: Four times a day (QID) | ORAL | Status: DC | PRN

## 2015-02-09 NOTE — Progress Notes (Signed)
Daily Post Partum Note  Vanessa Francis is a 24 y.o. [id number] PPD#2 s/p  SVD/intact perineum  @ 37/3.  Pregnancy c/b gestational thrombocytopenia, h/o chlamydia. Pt with mild gHTN pueripartum  24hr/overnight events:  none  Subjective:  Meeting all PP goals. Minimal and decreasing lochia and no s/s of pre-eclampsia  Objective:   Filed Vitals:   02/08/15 1500 02/08/15 1900 02/09/15 0008 02/09/15 0420  BP: 148/79 130/87 136/90 118/95  Pulse: 74 75 69 73  Temp: 98.7 F (37.1 C) 97.8 F (36.6 C) 97.8 F (36.6 C) 97.9 F (36.6 C)  TempSrc: Oral Oral Oral Oral  Resp: Height:      Weight:      SpO2:        Current Vital Signs 24h Vital Sign Ranges  T 97.9 F (36.6 C) Temp  Avg: 98.1 F (36.7 C)  Min: 97.8 F (36.6 C)  Max: 98.7 F (37.1 C)  BP (!) 118/95 mmHg BP  Min: 118/95  Max: 148/79  HR 73 Pulse  Avg: 74.3  Min: 69  Max: 83  RR 18 Resp  Avg: 19  Min: 18  Max: 20  SaO2 99 %   SpO2  Avg: 99 %  Min: 99 %  Max: 99 %       24 Hour I/O Current Shift I/O  Time Ins Outs 11/02 0701 - 11/03 0700 In: 881 [I.V.:881] Out: -       General: NAD Abdomen: FF below the umbilicus, nttp Perineum: deferred Skin:  Warm and dry.  Cardiovascular:Regular rate and rhythm. Respiratory:  Clear to auscultation bilateral. Normal respiratory effort Extremities: no c/c/e Neuro: 1+ brachial b/l  Medications Current Facility-Administered Medications  Medication Dose Route Frequency Provider Last Rate Last Dose  . acetaminophen (TYLENOL) tablet 650 mg  650 mg Oral Q4H PRN Chelsea C Ward, MD      . benzocaine-Menthol (DERMOPLAST) 20-0.5 % topical spray 1 application  1 application Topical PRN Chelsea C Ward, MD      . butorphanol (STADOL) injection 2 mg  2 mg Intravenous Q1H PRN Elenora Fender Ward, MD   2 mg at 02/07/15 2159  . witch hazel-glycerin (TUCKS) pad 1 application  1 application Topical PRN Chelsea C Ward, MD       And  . dibucaine (NUPERCAINAL) 1 % rectal ointment 1  application  1 application Rectal PRN Chelsea C Ward, MD      . diphenhydrAMINE (BENADRYL) capsule 25 mg  25 mg Oral Q6H PRN Chelsea C Ward, MD      . docusate sodium (COLACE) capsule 100 mg  100 mg Oral BID Elenora Fender Ward, MD   100 mg at 02/08/15 2231  . ibuprofen (ADVIL,MOTRIN) tablet 600 mg  600 mg Oral 4 times per day Leola Brazil, MD   600 mg at 02/09/15 0558  . lanolin ointment   Topical PRN Chelsea C Ward, MD      . medroxyPROGESTERone (DEPO-PROVERA) injection 150 mg  150 mg Intramuscular Once Chelsea C Ward, MD      . methylergonovine (METHERGINE) tablet 0.2 mg  0.2 mg Oral QID Elenora Fender Ward, MD   0.2 mg at 02/08/15 2231  . ondansetron (ZOFRAN) tablet 4 mg  4 mg Oral Q4H PRN Chelsea C Ward, MD       Or  . ondansetron (ZOFRAN) injection 4 mg  4 mg Intravenous Q4H PRN Elenora Fender Ward, MD      . oxyCODONE-acetaminophen (PERCOCET/ROXICET) [phone]  MG per tablet 1 tablet  1 tablet Oral Q4H PRN Elenora Fenderhelsea C Ward, MD   1 tablet at 02/08/15 [phone]  . prenatal multivitamin tablet 1 tablet  1 tablet Oral Q1200 Elenora Fenderhelsea C Ward, MD   1 tablet at 02/08/15 2054  . simethicone (MYLICON) chewable tablet 80 mg  80 mg Oral PRN Chelsea C Ward, MD      . sodium chloride 0.9 % injection 3 mL  3 mL Intravenous Q6H Conard NovakStephen D Jackson, MD   3 mL at 02/09/15 0415  . Tdap (BOOSTRIX) injection 0.5 mL  0.5 mL Intramuscular Once Chelsea C Ward, MD      . zolpidem (AMBIEN) tablet 5 mg  5 mg Oral QHS PRN Farrel Connersolleen Gutierrez, CNM   5 mg at 02/07/15 0147    Labs:   Recent Labs Lab 02/07/15 0738 02/07/15 2019 02/08/15 0548 02/09/15 0548  WBC 9.8 9.0 10.8  --   HGB 11.9* 11.6* 12.5  --   HCT 35.4 35.2 36.6  --   PLT 67* 58* 70* PLATELET CLUMPING, SUGGEST RECOLLECTION OF SAMPLE IN CITRATE TUBE.     Assessment & Plan:  Pt doing well *Postpartum/postop: routine care *Gestational thrombocytopenia and gHTN: increasing plts and no s/s of pre-x and just mild range BPs. Precautions given and will do 1wk BP check *Dispo:  home today  B POS / Rubella Immune / Varicella Immune/  RPR negative / HIV negative / HepBsAg negative / Tdap UTD: Yes/pap unknown / Breast  / Contraception: depo provera / [facility]  Dalal Livengood, Jr. MD [facility] Pager [phone]

## 2015-02-09 NOTE — Progress Notes (Signed)
Discharged to home to car via wc.  Rx given for home use.  Verbalized u/o of f/u appointments.

## 2015-02-09 NOTE — Discharge Instructions (Signed)
Vaginal Delivery, Care After Refer to this sheet in the next few weeks. These discharge instructions provide you with information on caring for yourself after delivery. Your caregiver may also give you specific instructions. Your treatment has been planned according to the most current medical practices available, but problems sometimes occur. Call your caregiver if you have any problems or questions after you go home. HOME CARE INSTRUCTIONS  Take over-the-counter or prescription medicines only as directed by your caregiver or pharmacist.  Do not drink alcohol, especially if you are breastfeeding or taking medicine to relieve pain.  Do not smoke tobacco.  Continue to use good perineal care. Good perineal care includes:  Wiping your perineum from back to front  Keeping your perineum clean.  You can do sitz baths twice a day, to help keep this area clean  Do not use tampons, douche or have sex until your caregiver says it is okay.  Shower only and avoid sitting in submerged water, aside from sitz baths  Wear a well-fitting bra that provides breast support.  Eat healthy foods.  Drink enough fluids to keep your urine clear or pale yellow.  Eat high-fiber foods such as whole grain cereals and breads, brown rice, beans, and fresh fruits and vegetables every day. These foods may help prevent or relieve constipation.  Avoid constipation with high fiber foods or medications, such as miralax or metamucil  Follow your caregiver's recommendations regarding resumption of activities such as climbing stairs, driving, lifting, exercising, or traveling.  Talk to your caregiver about resuming sexual activities. Resumption of sexual activities is dependent upon your risk of infection, your rate of healing, and your comfort and desire to resume sexual activity.  Try to have someone help you with your household activities and your newborn for at least a few days after you leave the  hospital.  Rest as much as possible. Try to rest or take a nap when your newborn is sleeping.  Increase your activities gradually.  Keep all of your scheduled postpartum appointments. It is very important to keep your scheduled follow-up appointments. At these appointments, your caregiver will be checking to make sure that you are healing physically and emotionally. SEEK MEDICAL CARE IF:   You are passing large clots from your vagina. Save any clots to show your caregiver.  You have a foul smelling discharge from your vagina.  You have trouble urinating.  You are urinating frequently.  You have pain when you urinate.  You have a change in your bowel movements.  You have increasing redness, pain, or swelling near your vaginal incision (episiotomy) or vaginal tear.  You have pus draining from your episiotomy or vaginal tear.  Your episiotomy or vaginal tear is separating.  You have painful, hard, or reddened breasts.  You have a severe headache.  You have blurred vision or see spots.  You feel sad or depressed.  You have thoughts of hurting yourself or your newborn.  You have questions about your care, the care of your newborn, or medicines.  You are dizzy or light-headed.  You have a rash.  You have nausea or vomiting.  You were breastfeeding and have not had a menstrual period within 12 weeks after you stopped breastfeeding.  You are not breastfeeding and have not had a menstrual period by the 12th week after delivery.  You have a fever. SEEK IMMEDIATE MEDICAL CARE IF:   You have persistent pain.  You have chest pain.  You have shortness of breath.  You faint.  You have leg pain.  You have stomach pain.  Your vaginal bleeding saturates two or more sanitary pads in 1 hour. MAKE SURE YOU:   Understand these instructions.  Will watch your condition.  Will get help right away if you are not doing well or get worse. Document Released: 03/22/2000  Document Revised: 08/09/2013 Document Reviewed: 11/20/2011 [facility] Patient Information 2015 [place], [place]. This information is not intended to replace advice given to you by your health care provider. Make sure you discuss any questions you have with your health care provider.  Sitz Bath A sitz bath is a warm water bath taken in the sitting position. The water covers only the hips and butt (buttocks). We recommend using one that fits in the toilet, to help with ease of use and cleanliness. It may be used for either healing or cleaning purposes. Sitz baths are also used to relieve pain, itching, or muscle tightening (spasms). The water may contain medicine. Moist heat will help you heal and relax.  HOME CARE  Take 3 to 4 sitz baths a day.  Fill the bathtub half-full with warm water.  Sit in the water and open the drain a little.  Turn on the warm water to keep the tub half-full. Keep the water running constantly.  Soak in the water for 15 to 20 minutes.  After the sitz bath, pat the affected area dry. GET HELP RIGHT AWAY IF: You get worse instead of better. Stop the sitz baths if you get worse. MAKE SURE YOU:  Understand these instructions.  Will watch your condition.  Will get help right away if you are not doing well or get worse. Document Released: 05/02/2004 Document Revised: 12/18/2011 Document Reviewed: 07/23/2010 [facility] Patient Information 2015 [place], [place]. This information is not intended to replace advice given to you by your health care provider. Make sure you discuss any questions you have with your health care provider.   Hypertension During Pregnancy Hypertension, or high blood pressure, is when there is extra pressure inside your blood vessels that carry blood from the heart to the rest of your body (arteries). It can happen at any time in life, including pregnancy. Hypertension during pregnancy can cause problems for you and your baby. Your baby might not weigh  as much as he or she should at birth or might be born early (premature). Very bad cases of hypertension during pregnancy can be life-threatening.  Different types of hypertension can occur during pregnancy. These include:  Chronic hypertension. This happens when a woman has hypertension before pregnancy and it continues during pregnancy.  Gestational hypertension. This is when hypertension develops during pregnancy.  Preeclampsia or toxemia of pregnancy. This is a very serious type of hypertension that develops only during pregnancy. It affects the whole body and can be very dangerous for both mother and baby.  Gestational hypertension and preeclampsia usually go away after your baby is born. Your blood pressure will likely stabilize within 6 weeks. Women who have hypertension during pregnancy have a greater chance of developing hypertension later in life or with future pregnancies. RISK FACTORS There are certain factors that make it more likely for you to develop hypertension during pregnancy. These include:  Having hypertension before pregnancy.  Having hypertension during a previous pregnancy.  Being overweight.  Being older than 40 years.  Being pregnant with more than one baby.  Having diabetes or kidney problems. SIGNS AND SYMPTOMS Chronic and gestational hypertension rarely cause symptoms. Preeclampsia has symptoms,  which may include:  Increased protein in your urine. Your health care provider will check for this at every prenatal visit.  Swelling of your hands and face.  Rapid weight gain.  Headaches.  Visual changes.  Being bothered by light.  Abdominal pain, especially in the upper right area.  Chest pain.  Shortness of breath.  Increased reflexes.  Seizures. These occur with a more severe form of preeclampsia, called eclampsia. DIAGNOSIS  You may be diagnosed with hypertension during a regular prenatal exam. At each prenatal visit, you may have:  Your  blood pressure checked.  A urine test to check for protein in your urine. The type of hypertension you are diagnosed with depends on when you developed it. It also depends on your specific blood pressure reading.  Developing hypertension before 20 weeks of pregnancy is consistent with chronic hypertension.  Developing hypertension after 20 weeks of pregnancy is consistent with gestational hypertension.  Hypertension with increased urinary protein is diagnosed as preeclampsia.  Blood pressure measurements that stay above 160 systolic or 110 diastolic are a sign of severe preeclampsia. TREATMENT Treatment for hypertension during pregnancy varies. Treatment depends on the type of hypertension and how serious it is.  If you take medicine for chronic hypertension, you may need to switch medicines.  Medicines called ACE inhibitors should not be taken during pregnancy.  Low-dose aspirin may be suggested for women who have risk factors for preeclampsia.  If you have gestational hypertension, you may need to take a blood pressure medicine that is safe during pregnancy. Your health care provider will recommend the correct medicine.  If you have severe preeclampsia, you may need to be in the hospital. Health care providers will watch you and your baby very closely. You also may need to take medicine called magnesium sulfate to prevent seizures and lower blood pressure.  Sometimes, an early delivery is needed. This may be the case if the condition worsens. It would be done to protect you and your baby. The only cure for preeclampsia is delivery.  Your health care provider may recommend that you take one low-dose aspirin (81 mg) each day to help prevent high blood pressure during your pregnancy if you are at risk for preeclampsia. You may be at risk for preeclampsia if:  You had preeclampsia or eclampsia during a previous pregnancy.  Your baby did not grow as expected during a previous  pregnancy.  You experienced preterm birth with a previous pregnancy.  You experienced a separation of the placenta from the uterus (placental abruption) during a previous pregnancy.  You experienced the loss of your baby during a previous pregnancy.  You are pregnant with more than one baby.  You have other medical conditions, such as diabetes or an autoimmune disease. HOME CARE INSTRUCTIONS  Schedule and keep all of your regular prenatal care appointments. This is important.  Take medicines only as directed by your health care provider. Tell your health care provider about all medicines you take.  Eat as little salt as possible.  Get regular exercise.  Do not drink alcohol.  Do not use tobacco products.  Do not drink products with caffeine.  Lie on your left side when resting. SEEK IMMEDIATE MEDICAL CARE IF:  You have severe abdominal pain.  You have sudden swelling in your hands, ankles, or face.  You gain 4 pounds (1.8 kg) or more in 1 week.  You vomit repeatedly.  You have vaginal bleeding.  You do not feel your baby moving  as much.  You have a headache.  You have blurred or double vision.  You have muscle twitching or spasms.  You have shortness of breath.  You have blue fingernails or lips.  You have blood in your urine. MAKE SURE YOU:  Understand these instructions.  Will watch your condition.  Will get help right away if you are not doing well or get worse.   This information is not intended to replace advice given to you by your health care provider. Make sure you discuss any questions you have with your health care provider.   Document Released: 12/11/2010 Document Revised: 04/15/2014 Document Reviewed: 10/22/2012 Elsevier Interactive Patient Education [employer]

## 2015-02-13 ENCOUNTER — Ambulatory Visit: Payer: No Typology Code available for payment source

## 2015-02-16 ENCOUNTER — Inpatient Hospital Stay: Payer: Medicaid Other | Attending: Oncology | Admitting: Oncology

## 2015-03-21 ENCOUNTER — Inpatient Hospital Stay: Payer: Medicaid Other | Attending: Internal Medicine

## 2015-07-26 ENCOUNTER — Encounter: Payer: Self-pay | Admitting: *Deleted

## 2016-04-08 HISTORY — PX: OTHER SURGICAL HISTORY: SHX169

## 2016-05-16 LAB — HM PAP SMEAR: HM Pap smear: POSITIVE

## 2016-05-16 LAB — HM HIV SCREENING LAB: HM HIV Screening: NEGATIVE

## 2016-05-28 DIAGNOSIS — Z8742 Personal history of other diseases of the female genital tract: Secondary | ICD-10-CM

## 2016-05-28 HISTORY — DX: Personal history of other diseases of the female genital tract: Z87.42

## 2018-10-20 DIAGNOSIS — Z8742 Personal history of other diseases of the female genital tract: Secondary | ICD-10-CM

## 2018-10-20 DIAGNOSIS — E663 Overweight: Secondary | ICD-10-CM

## 2018-10-22 ENCOUNTER — Ambulatory Visit (LOCAL_COMMUNITY_HEALTH_CENTER): Payer: Self-pay

## 2018-10-22 ENCOUNTER — Other Ambulatory Visit: Payer: Self-pay

## 2018-10-22 VITALS — BP 110/81 | Ht 66.0 in | Wt 206.0 lb

## 2018-10-22 DIAGNOSIS — Z30013 Encounter for initial prescription of injectable contraceptive: Secondary | ICD-10-CM

## 2018-10-22 DIAGNOSIS — Z3009 Encounter for other general counseling and advice on contraception: Secondary | ICD-10-CM

## 2018-10-22 DIAGNOSIS — Z3042 Encounter for surveillance of injectable contraceptive: Secondary | ICD-10-CM | POA: Insufficient documentation

## 2018-10-22 MED ORDER — MEDROXYPROGESTERONE ACETATE 150 MG/ML IM SUSP
150.0000 mg | Freq: Once | INTRAMUSCULAR | Status: AC
  Administered 2018-10-22: 150 mg via INTRAMUSCULAR

## 2018-10-22 NOTE — Progress Notes (Signed)
Last depo at [facility] 07/27/2018; 12.3 weeks post depo. Last physical with [facility] 08/28/2017 (ref depo order for 1 year by Jerline Pain, FNP). DMPA 150 mg IM today per Dr Joelene Millin Newton's standing order for one time depo when physical is due.

## 2019-01-13 ENCOUNTER — Ambulatory Visit: Payer: Self-pay

## 2019-01-19 ENCOUNTER — Other Ambulatory Visit: Payer: Self-pay

## 2019-01-19 ENCOUNTER — Ambulatory Visit (LOCAL_COMMUNITY_HEALTH_CENTER): Payer: Self-pay | Admitting: Physician Assistant

## 2019-01-19 ENCOUNTER — Encounter: Payer: Self-pay | Admitting: Advanced Practice Midwife

## 2019-01-19 VITALS — BP 122/81 | Ht 66.0 in | Wt 214.0 lb

## 2019-01-19 DIAGNOSIS — Z30013 Encounter for initial prescription of injectable contraceptive: Secondary | ICD-10-CM

## 2019-01-19 DIAGNOSIS — Z3009 Encounter for other general counseling and advice on contraception: Secondary | ICD-10-CM

## 2019-01-19 DIAGNOSIS — Z3042 Encounter for surveillance of injectable contraceptive: Secondary | ICD-10-CM

## 2019-01-19 MED ORDER — MEDROXYPROGESTERONE ACETATE 150 MG/ML IM SUSP
150.0000 mg | INTRAMUSCULAR | Status: AC
  Administered 2019-01-19: 150 mg via INTRAMUSCULAR

## 2019-01-19 NOTE — Progress Notes (Signed)
Depo given per provider order and tolerated well. PCP list also given. Hal Morales, RN

## 2019-01-19 NOTE — Progress Notes (Signed)
Pt here for Depo. Last Depo was 10/22/2018, 12 weeks and 5 days post Depo. Last PE 08/2017. Pt reports she has been tired lately and has migraines every other day and would like to discuss this with provide.Ronny Bacon, RN

## 2019-01-19 NOTE — Progress Notes (Signed)
Family Planning Visit- Repeat Yearly Visit  Subjective:  Vanessa Francis is a 28 y.o. being seen today for a Depo shot today.    She is currently using Depo-Provera injections for pregnancy prevention. Patient reports she does not  want a pregnancy in the next year. Patient  has Nausea; History of abnormal cervical Pap smear; Overweight; and Surveillance for Depo-Provera contraception on their problem list.  Chief Complaint  Patient presents with  . Contraception    Depo    Patient reports that she would like to continue with Depo and to discuss headaches today.  States that for the last few weeks she has been having headaches every 2-3 days.  Reports that the headaches are frontal, without aura, vision changes, nausea/vomiting, other neuro symptoms and relieved by OTC IB.  States that she has recently gone back to work and been having to help her child with online school at the same time.  Reports that she eats regularly.  Denies that there are patterns to the headaches that she has noticed.  Patient denies other concerns today.    Does the patient desire a pregnancy in the next year? (OK[facility] flowsheet)  See flowsheet for other program required questions.   Body mass index is 34.54 kg/m. - Patient is eligible for diabetes screening based on BMI and age >21?  not applicable HA1C ordered? not applicable  Patient reports 1 of partners in last year. Desires STI screening?  No - .  Does the patient have a current or past history of drug use? No   No components found for: HCV]   Health Maintenance Due  Topic Date Due  . TETANUS/TDAP  11/03/2008  . PAP SMEAR-Modifier  05/16/2017  . INFLUENZA VACCINE  11/07/2018    Review of Systems  All other systems reviewed and are negative.   The following portions of the patient's history were reviewed and updated as appropriate: allergies, current medications, past family history, past medical history, past social history, past surgical history  and problem list. Problem list updated.  Objective:   Vitals:   01/19/19 0917  BP: 122/81  Weight: 214 lb (97.1 kg)  Height: 5\' 6"  (1.676 m)    Physical Exam Vitals signs reviewed.  Constitutional:      General: She is not in acute distress.    Appearance: Normal appearance.  HENT:     Head: Normocephalic and atraumatic.  Pulmonary:     Effort: Pulmonary effort is normal.  Neurological:     Mental Status: She is alert and oriented to person, place, and time.  Psychiatric:        Mood and Affect: Mood normal.        Behavior: Behavior normal.        Thought Content: Thought content normal.        Judgment: Judgment normal.       Assessment and Plan:  [name] is a 28 y.o. female presenting to the [facility] Department for afamily planning visit   Contraception counseling: Reviewed BCMs available:  Risks, benefits, and SE.   Patient desires to continue with Depo, this was prescribed for patient. She will follow up in  3 months for surveillance.  She was told to call with any further questions, or with any concerns about this method of contraception.  Emphasized use of condoms 100% of the time for STI prevention.  1. Encounter for counseling regarding contraception Counseled that Depo is not likely causing headaches. Rec that patient  monitor headaches and follow up with PCP if they persist or worsen.  Rec headache diary to see if patient can ID triggers to avoid except for stress. Give PCP list.  2. Surveillance for Depo-Provera contraception OK to continue with Depo 150mg  IM q 11-13 weeks x 2  Rec condoms with all sex for STD protection. - medroxyPROGESTERone (DEPO-PROVERA) injection 150 mg     No follow-ups on file.  No future appointments.  Jerene Dilling, PA

## 2019-03-19 ENCOUNTER — Ambulatory Visit: Payer: Self-pay

## 2019-03-26 ENCOUNTER — Ambulatory Visit: Payer: Self-pay

## 2019-03-29 ENCOUNTER — Other Ambulatory Visit: Payer: Self-pay

## 2019-03-29 ENCOUNTER — Ambulatory Visit (LOCAL_COMMUNITY_HEALTH_CENTER): Payer: Self-pay

## 2019-03-29 DIAGNOSIS — Z719 Counseling, unspecified: Secondary | ICD-10-CM

## 2019-03-29 NOTE — Progress Notes (Signed)
Depo injection last administered 01/19/2019 and next due at the earliest on 04/06/2019. Client counseled 8 days too early for Depo injection and agrees to reschedule appt for 04/06/2019 at the earliest. Rich Number, RN

## 2019-04-06 ENCOUNTER — Ambulatory Visit (LOCAL_COMMUNITY_HEALTH_CENTER): Payer: Self-pay

## 2019-04-06 ENCOUNTER — Other Ambulatory Visit: Payer: Self-pay

## 2019-04-06 VITALS — BP 126/86 | Ht 66.0 in | Wt 215.0 lb

## 2019-04-06 DIAGNOSIS — Z3042 Encounter for surveillance of injectable contraceptive: Secondary | ICD-10-CM

## 2019-04-06 DIAGNOSIS — Z3009 Encounter for other general counseling and advice on contraception: Secondary | ICD-10-CM

## 2019-04-06 DIAGNOSIS — Z30013 Encounter for initial prescription of injectable contraceptive: Secondary | ICD-10-CM

## 2019-04-06 MED ORDER — MEDROXYPROGESTERONE ACETATE 150 MG/ML IM SUSP
150.0000 mg | Freq: Once | INTRAMUSCULAR | Status: AC
Start: 2019-04-06 — End: 2019-04-06
  Administered 2019-04-06: 150 mg via INTRAMUSCULAR

## 2019-04-06 NOTE — Progress Notes (Signed)
Physical past due; pt counseled that [facility] to resume physicals starting Jan 2021. Last depo at [facility] was 01/19/2019; 11.0 weeks post depo today. DMPA 150 mg IM today per 01/19/2019 order by Antoine Primas, PA.

## 2019-07-02 ENCOUNTER — Ambulatory Visit: Payer: Self-pay

## 2019-07-05 ENCOUNTER — Other Ambulatory Visit: Payer: Self-pay | Admitting: Physician Assistant

## 2019-07-05 DIAGNOSIS — Z3042 Encounter for surveillance of injectable contraceptive: Secondary | ICD-10-CM

## 2019-07-05 MED ORDER — MEDROXYPROGESTERONE ACETATE 150 MG/ML IM SUSP
150.0000 mg | Freq: Once | INTRAMUSCULAR | Status: AC
  Administered 2019-07-06: 150 mg via INTRAMUSCULAR

## 2019-07-05 NOTE — Progress Notes (Signed)
Per chart review, last RP 08/2017, CBE due 2022 and pap due 05/2019.  Provided that patient desires to continue with Depo and BP is normal, may have Depo on 07/06/2019.  Patient will need provider visit for repeat pap in June when next Depo is due, or if patient prefers she can schedule for pap only before her next Depo is due.

## 2019-07-06 ENCOUNTER — Other Ambulatory Visit: Payer: Self-pay

## 2019-07-06 ENCOUNTER — Ambulatory Visit (LOCAL_COMMUNITY_HEALTH_CENTER): Payer: Self-pay

## 2019-07-06 VITALS — BP 117/79 | Ht 66.0 in | Wt 215.0 lb

## 2019-07-06 DIAGNOSIS — Z3009 Encounter for other general counseling and advice on contraception: Secondary | ICD-10-CM

## 2019-07-06 DIAGNOSIS — Z30013 Encounter for initial prescription of injectable contraceptive: Secondary | ICD-10-CM

## 2019-07-06 DIAGNOSIS — Z3042 Encounter for surveillance of injectable contraceptive: Secondary | ICD-10-CM

## 2019-07-06 NOTE — Progress Notes (Signed)
13.0 weeks post depo today. DMPA 150 mg IM administered per Sadie Haber, PA order dated 07/05/2019 (one time depo order, pt needs PAP).

## 2019-10-04 ENCOUNTER — Other Ambulatory Visit: Payer: Self-pay

## 2019-10-04 ENCOUNTER — Other Ambulatory Visit: Payer: Self-pay | Admitting: Physician Assistant

## 2019-10-04 ENCOUNTER — Ambulatory Visit (LOCAL_COMMUNITY_HEALTH_CENTER): Payer: Self-pay

## 2019-10-04 VITALS — BP 114/73 | Ht 66.0 in | Wt 215.0 lb

## 2019-10-04 DIAGNOSIS — Z3042 Encounter for surveillance of injectable contraceptive: Secondary | ICD-10-CM

## 2019-10-04 DIAGNOSIS — Z3009 Encounter for other general counseling and advice on contraception: Secondary | ICD-10-CM

## 2019-10-04 MED ORDER — MEDROXYPROGESTERONE ACETATE 150 MG/ML IM SUSP
150.0000 mg | INTRAMUSCULAR | Status: DC
  Administered 2019-10-04: 150 mg via INTRAMUSCULAR

## 2019-10-04 NOTE — Progress Notes (Signed)
Per chart review, last RP 08/2017, CBE due 2022.  Pap follow up at [facility] 07/2019 and to follow up there in 07/2020 or sooner.  Provided that patient desires to continue with Depo and BP is normal, OK to continue with Depo. Patient due for annual visit 01/2020.

## 2019-10-04 NOTE — Progress Notes (Signed)
Pt is 12.6 weeks last depo. DMPA 150 mg IM administered per Sadie Haber, PA order dated 10/04/19.

## 2019-10-25 ENCOUNTER — Other Ambulatory Visit: Payer: Self-pay

## 2019-10-25 ENCOUNTER — Ambulatory Visit (LOCAL_COMMUNITY_HEALTH_CENTER): Payer: Self-pay

## 2019-10-25 DIAGNOSIS — Z111 Encounter for screening for respiratory tuberculosis: Secondary | ICD-10-CM

## 2019-10-25 NOTE — Progress Notes (Signed)
Return card provided

## 2019-10-28 ENCOUNTER — Other Ambulatory Visit: Payer: Self-pay

## 2019-10-28 ENCOUNTER — Ambulatory Visit (LOCAL_COMMUNITY_HEALTH_CENTER): Payer: Self-pay

## 2019-10-28 DIAGNOSIS — Z111 Encounter for screening for respiratory tuberculosis: Secondary | ICD-10-CM

## 2019-10-28 LAB — TB SKIN TEST
Induration: 0 mm
TB Skin Test: NEGATIVE

## 2019-11-05 ENCOUNTER — Other Ambulatory Visit: Payer: Self-pay

## 2019-11-05 ENCOUNTER — Ambulatory Visit (LOCAL_COMMUNITY_HEALTH_CENTER): Payer: Self-pay | Admitting: Physician Assistant

## 2019-11-05 ENCOUNTER — Encounter: Payer: Self-pay | Admitting: Physician Assistant

## 2019-11-05 VITALS — BP 128/72 | Ht 65.0 in | Wt 213.0 lb

## 2019-11-05 DIAGNOSIS — Z3009 Encounter for other general counseling and advice on contraception: Secondary | ICD-10-CM

## 2019-11-05 DIAGNOSIS — Z0000 Encounter for general adult medical examination without abnormal findings: Secondary | ICD-10-CM

## 2019-11-05 DIAGNOSIS — Z3042 Encounter for surveillance of injectable contraceptive: Secondary | ICD-10-CM

## 2019-11-05 MED ORDER — MEDROXYPROGESTERONE ACETATE 150 MG/ML IM SUSP
150.0000 mg | INTRAMUSCULAR | Status: AC
  Administered 2019-12-24 – 2020-04-05 (×2): 150 mg via INTRAMUSCULAR

## 2019-11-05 NOTE — Progress Notes (Signed)
Here today for PE for job needs. Last PE here was 08/28/2017. Last Pap Smear was 07/28/19 @ [facility] (in Care Everywhere.) Last Depo here was 10/04/2019 (4.4 weeks.) Declines all STD screening. Tawny Hopping, RN

## 2019-11-05 NOTE — Progress Notes (Signed)
Family Planning Visit- Repeat Yearly Visit  Subjective:  Vanessa Francis is a 29 y.o. G2P2002  being seen today for an well woman visit and to discuss family planning options.    She is currently using Depo Provera for pregnancy prevention. Patient reports she does not  want a pregnancy in the next year. Patient  has Nausea; History of abnormal cervical Pap smear; Overweight; and Surveillance for Depo-Provera contraception on their problem list.  No chief complaint on file.   Patient reports that she needs a physical form completed for a job.  States that she is happy with the Depo as her [employer] and desires to continue with this as her method.  Patient denies any changes to her personal and family histories today.  Denies concerns today.    See flowsheet for other program required questions.   Body mass index is 35.45 kg/m. - Patient is eligible for diabetes screening based on BMI and age >69?  not applicable HA1C ordered? not applicable  Patient reports 1 of partners in last year. Desires STI screening?  No - patient declines.   Has patient been screened once for HCV in the past?  No   Lab Results  Component Value Date   HCVAB <0.1 01/23/2015    Does the patient have current of drug use, have a partner with drug use, and/or has been incarcerated since last result? No  If yes-- Screen for HCV through [facility]   Does the patient meet criteria for HBV testing? No  Criteria:  -Household, sexual or needle sharing contact with HBV -History of drug use -HIV positive -Those with known Hep C   Health Maintenance Due  Topic Date Due  . COVID-19 Vaccine (1) Never done  . TETANUS/TDAP  Never done  . PAP SMEAR-Modifier  05/16/2017    Review of Systems  All other systems reviewed and are negative.   The following portions of the patient's history were reviewed and updated as appropriate: allergies, current medications, past family history, past medical history, past social  history, past surgical history and problem list. Problem list updated.  Objective:   Vitals:   11/05/19 1351  BP: 128/72  Weight: (!) 213 lb (96.6 kg)  Height: 5\' 5"  (1.651 m)    Physical Exam Vitals and nursing note reviewed.  Constitutional:      Appearance: Normal appearance.  HENT:     Head: Normocephalic and atraumatic.  Eyes:     Conjunctiva/sclera: Conjunctivae normal.  Cardiovascular:     Rate and Rhythm: Normal rate and regular rhythm.  Pulmonary:     Effort: Pulmonary effort is normal.     Breath sounds: Normal breath sounds.  Abdominal:     Palpations: Abdomen is soft. There is no mass.     Tenderness: There is no abdominal tenderness. There is no guarding or rebound.  Musculoskeletal:     Cervical back: Neck supple. No tenderness.  Lymphadenopathy:     Cervical: No cervical adenopathy.  Skin:    General: Skin is warm and dry.  Neurological:     Mental Status: She is alert and oriented to person, place, and time.  Psychiatric:        Mood and Affect: Mood normal.        Behavior: Behavior normal.        Thought Content: Thought content normal.        Judgment: Judgment normal.       Assessment and Plan:  Vanessa Francis is  a 29 y.o. female G2P2002 presenting to the [facility] Department for an yearly well woman exam/family planning visit  Contraception counseling: Reviewed all forms of birth control options in the tiered based approach. available including abstinence; over the counter/barrier methods; hormonal contraceptive medication including pill, patch, ring, injection,contraceptive implant, ECP; hormonal and nonhormonal IUDs; permanent sterilization options including vasectomy and the various tubal sterilization modalities. Risks, benefits, and typical effectiveness rates were reviewed.  Questions were answered.  Written information was also given to the patient to review.  Patient desires to continue with Depo, this was prescribed for  patient. She will follow up in  2 months and prn for surveillance.  She was told to call with any further questions, or with any concerns about this method of contraception.  Emphasized use of condoms 100% of the time for STI prevention.  Patient was not a candidate for ECP today.    1. Encounter for counseling regarding contraception Reviewed with patient when to call clinic for irregular bleeding. Counseled about risks to bone density with long-term Depo and enc weight bearing exercise and calcium intake to maintain bone density. Rec condoms with all sex.  2. Well woman exam (no gynecological exam) Reviewed healthy habits and enc to continue with walking as exercise. Enc MVI 1 po daily. Enc to establish with /follow up with PCP for illness, primary care concerns and age appropriate screenings.  3. Surveillance for Depo-Provera contraception Continue with Depo 150 mg IM q 11-13 weeks until RP 10/2020.   No follow-ups on file.  No future appointments.  Matt Holmes, PA

## 2019-12-24 ENCOUNTER — Ambulatory Visit (LOCAL_COMMUNITY_HEALTH_CENTER): Payer: Self-pay

## 2019-12-24 ENCOUNTER — Other Ambulatory Visit: Payer: Self-pay

## 2019-12-24 VITALS — BP 117/74 | Ht 66.0 in | Wt 211.0 lb

## 2019-12-24 DIAGNOSIS — Z3009 Encounter for other general counseling and advice on contraception: Secondary | ICD-10-CM

## 2019-12-24 DIAGNOSIS — Z3042 Encounter for surveillance of injectable contraceptive: Secondary | ICD-10-CM

## 2019-12-24 NOTE — Progress Notes (Signed)
Depo given per C. Rolley Sims PA 10/04/19 order (left delt). Tolerated well.Richmond Campbell, RN

## 2020-04-04 ENCOUNTER — Ambulatory Visit: Payer: Self-pay

## 2020-04-05 ENCOUNTER — Ambulatory Visit (LOCAL_COMMUNITY_HEALTH_CENTER): Payer: Self-pay

## 2020-04-05 ENCOUNTER — Other Ambulatory Visit: Payer: Self-pay

## 2020-04-05 VITALS — BP 116/83 | Ht 66.0 in | Wt 204.5 lb

## 2020-04-05 DIAGNOSIS — Z3009 Encounter for other general counseling and advice on contraception: Secondary | ICD-10-CM

## 2020-04-05 DIAGNOSIS — Z3042 Encounter for surveillance of injectable contraceptive: Secondary | ICD-10-CM

## 2020-04-05 NOTE — Progress Notes (Signed)
14 weeks 5 days since last depo. Voices no problems. Depo given Right Deltoid per order by C. Barnard, [place] dated 11/05/2019. Tolerated well. Next depo due 06/21/2020, pt aware. Jerel Shepherd, RN

## 2020-04-10 ENCOUNTER — Other Ambulatory Visit: Payer: Self-pay

## 2020-04-10 ENCOUNTER — Encounter: Payer: Self-pay | Admitting: Emergency Medicine

## 2020-04-10 ENCOUNTER — Emergency Department
Admission: EM | Admit: 2020-04-10 | Discharge: 2020-04-10 | Disposition: A | Discharge status: Home / Self Care | Payer: Self-pay | Attending: Emergency Medicine | Admitting: Emergency Medicine

## 2020-04-10 DIAGNOSIS — Y92410 Unspecified street and highway as the place of occurrence of the external cause: Secondary | ICD-10-CM | POA: Insufficient documentation

## 2020-04-10 DIAGNOSIS — S161XXA Strain of muscle, fascia and tendon at neck level, initial encounter: Secondary | ICD-10-CM | POA: Insufficient documentation
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
## 2020-04-17 ENCOUNTER — Other Ambulatory Visit: Payer: Self-pay | Admitting: Chiropractor

## 2020-04-17 ENCOUNTER — Ambulatory Visit
Admission: RE | Admit: 2020-04-17 | Discharge: 2020-04-17 | Disposition: A | Discharge status: Home / Self Care | Payer: Self-pay | Source: Ambulatory Visit | Attending: Chiropractor | Admitting: Chiropractor

## 2020-04-17 DIAGNOSIS — M542 Cervicalgia: Secondary | ICD-10-CM | POA: Insufficient documentation

## 2020-04-17 DIAGNOSIS — Y939 Activity, unspecified: Secondary | ICD-10-CM | POA: Insufficient documentation

## 2020-04-17 DIAGNOSIS — Y929 Unspecified place or not applicable: Secondary | ICD-10-CM | POA: Insufficient documentation

## 2020-04-17 DIAGNOSIS — M545 Low back pain, unspecified: Secondary | ICD-10-CM | POA: Insufficient documentation

## 2020-07-03 ENCOUNTER — Ambulatory Visit: Payer: Self-pay

## 2020-07-04 ENCOUNTER — Ambulatory Visit: Payer: Self-pay

## 2020-07-06 ENCOUNTER — Ambulatory Visit: Payer: Self-pay

## 2020-08-03 ENCOUNTER — Ambulatory Visit (LOCAL_COMMUNITY_HEALTH_CENTER): Payer: Self-pay | Admitting: Advanced Practice Midwife

## 2020-08-03 ENCOUNTER — Ambulatory Visit: Payer: Self-pay

## 2020-08-03 ENCOUNTER — Other Ambulatory Visit: Payer: Self-pay

## 2020-08-03 VITALS — BP 135/86 | Ht 65.6 in | Wt 206.8 lb

## 2020-08-03 DIAGNOSIS — Z3009 Encounter for other general counseling and advice on contraception: Secondary | ICD-10-CM

## 2020-08-03 DIAGNOSIS — E669 Obesity, unspecified: Secondary | ICD-10-CM | POA: Insufficient documentation

## 2020-08-03 DIAGNOSIS — Z9889 Other specified postprocedural states: Secondary | ICD-10-CM

## 2020-08-03 DIAGNOSIS — Z30013 Encounter for initial prescription of injectable contraceptive: Secondary | ICD-10-CM

## 2020-08-03 DIAGNOSIS — Z8742 Personal history of other diseases of the female genital tract: Secondary | ICD-10-CM

## 2020-08-03 HISTORY — DX: Other specified postprocedural states: Z98.890

## 2020-08-03 LAB — PREGNANCY, URINE: Preg Test, Ur: NEGATIVE

## 2020-08-03 MED ORDER — MEDROXYPROGESTERONE ACETATE 150 MG/ML IM SUSP
150.0000 mg | Freq: Once | INTRAMUSCULAR | Status: AC
  Administered 2020-08-03: 150 mg via INTRAMUSCULAR

## 2020-08-03 NOTE — Progress Notes (Signed)
PT test was negative. Patient given Depo without any complications.  Pt given a reminder card to return in July for Pap/PE/Depo. Berdie Ogren, RN

## 2020-08-03 NOTE — Progress Notes (Signed)
Contraception/Family Planning VISIT ENCOUNTER NOTE  Subjective:   Vanessa Francis is a 30 y.o.SHF SBF G2P2002 (7,5) female here for reproductive life counseling.  Desires restart of DMPA for BCM.  Reports she does not want a pregnancy in the next year. Denies abnormal vaginal bleeding, discharge, pelvic pain, problems with intercourse or other gynecologic concerns. LMP 08/02/20. Last sex 05/22/20 with condom; with current partner x 8 years. Last physical 11/05/19. LEEP 2018 after ASCUS HPV+ 05/16/16. Last DMPA 04/05/20.   Gynecologic History Patient's last menstrual period was 08/02/2020. Contraception: nothing  Health Maintenance Due  Topic Date Due  . COVID-19 Vaccine (1) Never done  . TETANUS/TDAP  Never done  . PAP SMEAR-Modifier  05/16/2017     The following portions of the patient's history were reviewed and updated as appropriate: allergies, current medications, past family history, past medical history, past social history, past surgical history and problem list.  Review of Systems Pertinent items are noted in HPI.   Objective:  BP 135/86   Ht 5' 5.6" (1.666 m)   Wt 206 lb 12.8 oz (93.8 kg)   LMP 08/02/2020   BMI 33.79 kg/m  Gen: well appearing, NAD HEENT: no scleral icterus CV: RR Lung: Normal WOB Ext: warm well perfused     Assessment and Plan:   Contraception counseling: Reviewed all forms of birth control options in the tiered based approach. available including abstinence; over the counter/barrier methods; hormonal contraceptive medication including pill, patch, ring, injection,contraceptive implant, ECP; hormonal and nonhormonal IUDs; permanent sterilization options including vasectomy and the various tubal sterilization modalities. Risks, benefits, and typical effectiveness rates were reviewed.  Questions were answered.  Written information was also given to the patient to review.  Patient desires DMPA restart, this was prescribed for patient. She will follow up in   11-13 wks for surveillance.  She was told to call with any further questions, or with any concerns about this method of contraception.  Emphasized use of condoms 100% of the time for STI prevention.  Patient was offered ECP. ECP was not accepted by the patient. ECP counseling was not given - see RN documentation  1. Encounter for initial prescription of injectable contraceptive If PT neg today may have DMPA 150 mg IM x1 Please counsel on need for abstinance/back up condoms next 7 days Needs physical and pap 10/2020 - Pregnancy, urine  2. H/O LEEP 2018 No f/u until 07/28/19 neg  3. Class 1 obesity in adult, unspecified BMI, unspecified obesity type, unspecified whether serious comorbidity present   4. History of abnormal cervical Pap smear 05/16/16 ASCUS HPV + 08/2016 LEEP 07/28/19 neg    Please refer to After Visit Summary for other counseling recommendations.   No follow-ups on file.  Alberteen Spindle, CNM [facility] DEPARTMENT

## 2020-10-30 ENCOUNTER — Ambulatory Visit (LOCAL_COMMUNITY_HEALTH_CENTER): Payer: Self-pay | Admitting: Advanced Practice Midwife

## 2020-10-30 ENCOUNTER — Other Ambulatory Visit: Payer: Self-pay

## 2020-10-30 ENCOUNTER — Encounter: Payer: Self-pay | Admitting: Advanced Practice Midwife

## 2020-10-30 VITALS — BP 111/73 | Ht 65.6 in | Wt 206.8 lb

## 2020-10-30 DIAGNOSIS — Z3042 Encounter for surveillance of injectable contraceptive: Secondary | ICD-10-CM

## 2020-10-30 DIAGNOSIS — Z01419 Encounter for gynecological examination (general) (routine) without abnormal findings: Secondary | ICD-10-CM

## 2020-10-30 DIAGNOSIS — Z3009 Encounter for other general counseling and advice on contraception: Secondary | ICD-10-CM

## 2020-10-30 MED ORDER — MEDROXYPROGESTERONE ACETATE 150 MG/ML IM SUSP
150.0000 mg | INTRAMUSCULAR | Status: AC
Start: 2020-10-30 — End: 2021-10-25
  Administered 2020-10-30 – 2021-02-12 (×2): 150 mg via INTRAMUSCULAR

## 2020-10-30 NOTE — Progress Notes (Signed)
[facility] Vanessa Francis [address] Main Number: [phone]    Family Planning Visit- Initial Visit  Subjective:  Vanessa Francis is a 30 y.o. SBF G2P2002 (7,5)  being seen today for an initial annual visit and to discuss contraceptive options.  The patient is currently using Depo Provera for pregnancy prevention. Patient reports she does not want a pregnancy in the next year.  Patient has the following medical conditions has History of abnormal cervical Pap smear; Surveillance for Depo-Provera contraception; H/O LEEP 2018; and Obesity BMI=33.7 on their problem list.  Chief Complaint  Patient presents with   Annual Exam   Contraception    Depo (in arm)    Patient reports here for physical and DMPA. Last DMPA 08/03/20. Last pap 07/28/19 neg. Hx LEEP 2018 after ASCUS HPV + 05/16/16. No menses on DMPA. Last sex 10/09/20 with condom; with current partner x 8 years; 1 partner in last 3 mo. Last PE 11/05/19. 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 Living with her 2 kids.   Patient denies drugs  Body mass index is 33.79 kg/m. - Patient is eligible for diabetes screening based on BMI and age >40?  not applicable HA1C ordered? no  Patient reports 1  partner/s in last year. Desires STI screening?  decliines  Has patient been screened once for HCV in the past?  No   Lab Results  Component Value Date   HCVAB <0.1 01/23/2015    Does the patient have current drug use (including MJ), have a partner with drug use, and/or has been incarcerated since last result? No  If yes-- Screen for HCV through [facility]   Does the patient meet criteria for HBV testing? No  Criteria:  -Household, sexual or needle sharing contact with HBV -History of drug use -HIV positive -Those with known Hep C   Health Maintenance Due  Topic Date Due   COVID-19 Vaccine (1) Never done   TETANUS/TDAP  Never done   PAP  SMEAR-Modifier  05/16/2017    Review of Systems  All other systems reviewed and are negative.  The following portions of the patient's history were reviewed and updated as appropriate: allergies, current medications, past family history, past medical history, past social history, past surgical history and problem list. Problem list updated.   See flowsheet for other program required questions.  Objective:   Vitals:   10/30/20 1602  BP: 111/73  Weight: 206 lb 12.8 oz (93.8 kg)  Height: 5' 5.6" (1.666 m)    Physical Exam Constitutional:      Appearance: Normal appearance. She is obese.  HENT:     Head: Normocephalic and atraumatic.     Mouth/Throat:     Mouth: Mucous membranes are moist.     Comments: Last dental exam age 32; strongly encouraged exam asap Eyes:     Conjunctiva/sclera: Conjunctivae normal.  Neck:     Thyroid: No thyroid mass, thyromegaly or thyroid tenderness.  Cardiovascular:     Rate and Rhythm: Normal rate and regular rhythm.  Pulmonary:     Effort: Pulmonary effort is normal.     Breath sounds: Normal breath sounds.  Abdominal:     Palpations: Abdomen is soft.     Comments: Soft without masses or tenderness,   Genitourinary:    Comments: Pt declines STD exam, pap not needed, in monogomous relationship, denies sxs vaginitis Musculoskeletal:        General: Normal  range of motion.     Cervical back: Normal range of motion and neck supple.  Skin:    General: Skin is warm and dry.  Neurological:     Mental Status: She is alert.  Psychiatric:        Mood and Affect: Mood normal.      Assessment and Plan:  Vanessa Francis is a 30 y.o. female presenting to the [facility] Department for an initial annual wellness/contraceptive visit  Contraception counseling: Reviewed all forms of birth control options in the tiered based approach. available including abstinence; over the counter/barrier methods; hormonal contraceptive medication  including pill, patch, ring, injection,contraceptive implant, ECP; hormonal and nonhormonal IUDs; permanent sterilization options including vasectomy and the various tubal sterilization modalities. Risks, benefits, and typical effectiveness rates were reviewed.  Questions were answered.  Written information was also given to the patient to review.  Patient desires continuation of DMPA, this was prescribed for patient. She will follow up in  11-13 wks for surveillance.  She was told to call with any further questions, or with any concerns about this method of contraception.  Emphasized use of condoms 100% of the time for STI prevention.  Patient was not  offered ECP.  ECP was not accepted by the patient. ECP counseling was not given - see RN documentation  1. Family planning Please give pt dental list and encourage exam - medroxyPROGESTERone (DEPO-PROVERA) injection 150 mg  2. Well woman exam with routine gynecological exam   3. Encounter for surveillance of injectable contraceptive DMPA 150 mg IM today and q 11-13 wks x 1 year     No follow-ups on file.  No future appointments.  Alberteen Spindle, CNM

## 2020-10-30 NOTE — Progress Notes (Signed)
Pt to clinic for physical and depo. Declines STD screening. Pt denies any vaginal symptoms.

## 2020-10-30 NOTE — Progress Notes (Signed)
Provider orders completed. 

## 2021-02-09 ENCOUNTER — Ambulatory Visit: Payer: Self-pay

## 2021-02-12 ENCOUNTER — Ambulatory Visit (LOCAL_COMMUNITY_HEALTH_CENTER): Payer: Self-pay

## 2021-02-12 ENCOUNTER — Other Ambulatory Visit: Payer: Self-pay

## 2021-02-12 VITALS — BP 113/73 | Ht 65.6 in | Wt 205.0 lb

## 2021-02-12 DIAGNOSIS — Z3009 Encounter for other general counseling and advice on contraception: Secondary | ICD-10-CM

## 2021-02-12 DIAGNOSIS — Z3042 Encounter for surveillance of injectable contraceptive: Secondary | ICD-10-CM

## 2021-02-12 NOTE — Progress Notes (Signed)
DMPA 150 mg given IM (Right Deltoid) per 10/30/2020 order by Arnetha Courser, CNM and tolerated well.  Patient is 15 w post last Depo. Hart Carwin, RN

## 2021-05-28 ENCOUNTER — Other Ambulatory Visit: Payer: Self-pay

## 2021-06-05 ENCOUNTER — Ambulatory Visit (LOCAL_COMMUNITY_HEALTH_CENTER): Payer: Self-pay

## 2021-06-05 ENCOUNTER — Other Ambulatory Visit: Payer: Self-pay

## 2021-06-05 DIAGNOSIS — Z111 Encounter for screening for respiratory tuberculosis: Secondary | ICD-10-CM

## 2021-06-08 ENCOUNTER — Other Ambulatory Visit: Payer: Self-pay

## 2021-06-08 ENCOUNTER — Ambulatory Visit (LOCAL_COMMUNITY_HEALTH_CENTER): Payer: Self-pay

## 2021-06-08 DIAGNOSIS — Z111 Encounter for screening for respiratory tuberculosis: Secondary | ICD-10-CM

## 2021-06-08 LAB — TB SKIN TEST
Induration: 0 mm
TB Skin Test: NEGATIVE

## 2022-02-17 ENCOUNTER — Other Ambulatory Visit: Payer: Self-pay

## 2022-02-17 ENCOUNTER — Emergency Department
Admission: EM | Admit: 2022-02-17 | Discharge: 2022-02-17 | Disposition: A | Discharge status: Home / Self Care | Payer: Self-pay | Attending: Emergency Medicine | Admitting: Emergency Medicine

## 2022-02-17 DIAGNOSIS — K0490 Unspecified diseases of pulp and periapical tissues: Secondary | ICD-10-CM | POA: Insufficient documentation

## 2022-02-17 DIAGNOSIS — K047 Periapical abscess without sinus: Secondary | ICD-10-CM | POA: Insufficient documentation

## 2022-02-17 DIAGNOSIS — K0500 Acute gingivitis, plaque induced: Secondary | ICD-10-CM | POA: Insufficient documentation

## 2022-02-17 DIAGNOSIS — K0510 Chronic gingivitis, plaque induced: Secondary | ICD-10-CM

## 2022-02-17 MED ORDER — CHLORHEXIDINE GLUCONATE 0.12 % MT SOLN: 15.0000 mL | Freq: Two times a day (BID) | OROMUCOSAL | 0 refills | Status: AC

## 2022-02-17 MED ORDER — AMOXICILLIN-POT CLAVULANATE 875-125 MG PO TABS: 1.0000 | ORAL_TABLET | Freq: Two times a day (BID) | ORAL | 0 refills | Status: AC

## 2022-02-17 MED ORDER — OXYCODONE-ACETAMINOPHEN 5-325 MG PO TABS: 1.0000 | ORAL_TABLET | Freq: Four times a day (QID) | ORAL | 0 refills | Status: DC | PRN

## 2022-02-17 NOTE — ED Triage Notes (Signed)
Pt complaining of dental pain on the left side since yesterday. Pt says it worsened overnight, woke up sensitive with swelling. Denies eating anything hard.

## 2022-02-17 NOTE — Discharge Instructions (Addendum)
OPTIONS FOR DENTAL FOLLOW UP CARE ° °[place] [facility] - Local Safety Net Dental Clinics °http://www.ncdhhs.gov/dph/oralhealth/services/safetynetclinics.htm °  °[facility] (336-562-3123) ° °[facility] [place] (919-933-9087) ° °[facility] ([phone] ext [phone]) ° °[facility] ([phone]) ° °[facility] ([phone]) °This clinic caters to the indigent population and is on a lottery system. °Location: °[facility], [address], [place] °Clinic Hours: °Wednesdays from 6pm - 9pm, patients seen by a lottery system. °For dates, call or go to www.med.[facility].edu/[facility]/patients/Dental-[facility] °Services: °Cleanings, fillings and simple extractions. °Payment Options: °DENTAL WORK IS FREE OF CHARGE. Bring proof of income or support. °Best way to get seen: °Arrive at 5:15 pm - this is a lottery, NOT first come/first serve, so arriving earlier will not increase your chances of being seen. °  °  °[facility] °[phone] °Select option 1 for emergencies °  °Location: °[facility], [address], [place] °Clinic Hours: °No walk-ins accepted - call the day before to schedule an appointment. °Check in times are 9:30 am and 1:30 pm. °Services: °Simple extractions, temporary fillings, pulpectomy/pulp debridement, uncomplicated abscess drainage. °Payment Options: °PAYMENT IS DUE AT THE TIME OF SERVICE.  Fee is usually $100-200, additional surgical procedures (e.g. abscess drainage) may be extra. °Cash, checks, Visa/MasterCard accepted.  Can file Medicaid if patient is covered for dental - patient should call case worker to check. °No discount for [facility] patients. °Best way to get seen: °MUST call the day before and get onto the schedule. Can usually be seen the next 1-2 days. No walk-ins accepted. °  °  °[facility] °[phone] °   °Location: °[facility], [address], [place] °Clinic Hours: °M, W, Th, F 8am or 1:30pm, Tues 9a or 1:30 - first come/first served. °Services: °Simple extractions, temporary fillings, uncomplicated abscess drainage.  You do not need to be an [place] resident. °Payment Options: °PAYMENT IS DUE AT THE TIME OF SERVICE. °Dental insurance, otherwise sliding scale - bring proof of income or support. °Depending on income and treatment needed, cost is usually $50-200. °Best way to get seen: °Arrive early as it is first come/first served. °  °  °[facility] °[phone] °  °Location: °[address] °Clinic Hours: °Mon-Thu 8a-5p °Services: °Most basic dental services including extractions and fillings. °Payment Options: °PAYMENT IS DUE AT THE TIME OF SERVICE. °Sliding scale, up to 50% off - bring proof if income or support. °Medicaid with dental option accepted. °Best way to get seen: °Call to schedule an appointment, can usually be seen within 2 weeks OR they will try to see walk-ins - show up at 8a or 2p (you may have to wait). °  °  °[facility] °919-245-2435 °[place] RESIDENTS ONLY °  °Location: °[facility], [address], [place], [place] [address] °Clinic Hours: By appointment only. °Monday - Thursday 8am-5pm, Friday 8am-12pm °Services: Cleanings, fillings, extractions. °Payment Options: °PAYMENT IS DUE AT THE TIME OF SERVICE. °Cash, Visa or MasterCard. Sliding scale - $30 minimum per service. °Best way to get seen: °Come in to office, complete packet and make an appointment - need proof of income °or support monies for each household member and proof of [place] residence. °Usually takes about a month to get in. °  °  °[facility] °[phone] °  °Location: °[address]   [place] °Clinic Hours: Walk-in Urgent Care Dental Services are offered Monday-Friday  mornings only. °The numbers of emergencies accepted daily is limited to the number of °providers available. °Maximum 15 - Mondays, Wednesdays & Thursdays °Maximum 10 - Tuesdays & Fridays °Services: °You do not need to be a [place] resident to be seen for a dental emergency. °Emergencies are defined as pain, swelling, abnormal bleeding, or dental trauma. Walkins will receive x-rays if needed. °NOTE: Dental cleaning is not an emergency. °Payment Options: °PAYMENT IS DUE AT THE TIME OF SERVICE. °Minimum co-pay is $40.00 for uninsured patients. °Minimum co-pay is $3.00 for Medicaid with dental coverage. °Dental Insurance is accepted and must be presented at time of visit. °Medicare does not cover dental. °Forms of payment: Cash, credit card, checks. °Best way to get seen: °If not previously registered with the clinic, walk-in dental registration begins at 7:15 am and is on a first come/first serve basis. °If previously registered with the clinic, call to make an appointment. °  °  °[facility] °919-776-4359 °[place] RESIDENTS ONLY °  °Location: °[address], [place], [place] °Clinic Hours: °Mon-Thu 10a-2p °Services: Extractions only! °Payment Options: °FREE (donations accepted) - bring proof of income or support °Best way to get seen: °Call and schedule an appointment OR come at 8am on the 1st Monday of every month (except for holidays) when it is first come/first served. °  °  °[facility] °[phone] °  °Location: °[address], [place] °Clinic Hours: °Friday mornings °Services, Payment Options, Best way to get seen: °Call for info °

## 2022-02-17 NOTE — ED Provider Notes (Signed)
[facility] Provider Note    Event Date/Time   First MD Initiated Contact with Patient 02/17/22 1207     (approximate)   History   No chief complaint on file.   HPI  Vanessa Francis is a 31 y.o. female here with dental pain.  The patient states that she has had sensitivity to the left upper molar for the last several months.  She just tries to avoid chewing on that side.  Over the last 24 hours, however, she has developed progressively worsening pain, swelling, and tenderness to the left upper jaw and face.  This is new.  No recent trauma.  No fevers or chills.  She is not diabetic or immunosuppressed.  No recent dental work.  She does states she had some gum irritation in the area.  No other complaints.     Physical Exam   Triage Vital Signs: ED Triage Vitals  Enc Vitals Group     BP 02/17/22 1209 122/82     Pulse Rate 02/17/22 1209 80     Resp 02/17/22 1209 18     Temp 02/17/22 1209 98.7 F (37.1 C)     Temp Source 02/17/22 1209 Oral     SpO2 02/17/22 1209 100 %     Weight 02/17/22 1121 200 lb (90.7 kg)     Height 02/17/22 1121 5\' 6"  (1.676 m)     Head Circumference --      Peak Flow --      Pain Score 02/17/22 1121 8     Pain Loc --      Pain Edu? --      Excl. in GC? --     Most recent vital signs: Vitals:   02/17/22 1209  BP: 122/82  Pulse: 80  Resp: 18  Temp: 98.7 F (37.1 C)  SpO2: 100%     General: Awake, no distress.  CV:  Good peripheral perfusion.  Resp:  Normal effort.  Abd:  No distention.  Other:  Markedly poor dentition diffusely, with diffuse gingivitis.  Left upper wisdom tooth appears to be coming in obliquely and impacting the left back molar.  There is moderate surrounding gingival edema and tenderness along the superior aspect of the tooth.  No overt abscess.  There is mild soft tissue swelling external to this over the maxillary cheek but no overt fluctuance.  No extension to the eye.  Sublingual space is  soft.   ED Results / Procedures / Treatments   Labs (all labs ordered are listed, but only abnormal results are displayed) Labs Reviewed - No data to display   EKG    RADIOLOGY    I also independently reviewed and agree with radiologist interpretations.   PROCEDURES:  Critical Care performed: No   MEDICATIONS ORDERED IN ED: Medications - No data to display   IMPRESSION / MDM / ASSESSMENT AND PLAN / ED COURSE  I reviewed the triage vital signs and the nursing notes.                               This patient presents to the ED for concern of dental pain, this involves an extensive number of treatment options, and is a complaint that carries with it a high risk of complications and morbidity.  The differential diagnosis includes gingivitis, ANUG, periapical abscess, facial abscess, dental caries with pain.   Co morbidities that complicate the patient evaluation  None   Problem List / ED Course / Critical interventions / Medication management  Dental pain Exam is c/w periapical abscess/pulpitis with diffuse gingivitis. No signs of facial or gingival abscess. No focal fluctuance. Pt is HDS and well appearing without signs of systemic illness.  She is not diabetic or immunosuppressed.  No fevers.  No sublingual space involvement, no lower jaw involvement, no signs of airway compromise.  No evidence of peritonsillar spread.  We will treat with antibiotics and outpatient dentistry referral. I have reviewed the patients home medicines and have made adjustments as needed   Social Determinants of Health:  For access to dental care   Test / Admission - Considered:  Patient hemodynamically stable, no signs of systemic illness, will treat as outpatient.   FINAL CLINICAL IMPRESSION(S) / ED DIAGNOSES   Final diagnoses:  Dental infection  Disease of pulp and periapical tissues  Gingivitis     Rx / DC Orders   ED Discharge Orders          Ordered     amoxicillin-clavulanate (AUGMENTIN) 875-125 MG tablet  2 times daily        02/17/22 1252    chlorhexidine (PERIDEX) 0.12 % solution  2 times daily        02/17/22 1252    oxyCODONE-acetaminophen (PERCOCET) 5-325 MG tablet  Every 6 hours PRN        02/17/22 1252             Note:  This document was prepared using Dragon voice recognition software and may include unintentional dictation errors.   Shaune Pollack, MD 02/17/22 1410

## 2022-04-08 NOTE — L&D Delivery Note (Signed)
Delivery Note   Vanessa Francis is a 32 y.o. [id number] at [redacted]w[redacted]d Estimated Date of Delivery: 01/19/23  PRE-OPERATIVE DIAGNOSIS:  1) [redacted]w[redacted]d pregnancy.  2) Di-di twin Pregnancy 3) fetal growth restriction  POST-OPERATIVE DIAGNOSIS:  1) [redacted]w[redacted]d pregnancy s/p vaginal delivery, vertex presentation of di-di twins   Laurieanne, Cavalcante [[id number]]  Vaginal, Spontaneous    Lavilla, Steinbrunner Girl DOYLENE NIEZGODA [[id number]]  Vaginal, Spontaneous   Delivery Type:    Shauntia, Franzone [[id number]]  Vaginal, Spontaneous    Shauntia, Franzone Girl MERTHA CLUKEY [[id number]]  Vaginal, Spontaneous   Delivery Anesthesia:    Mckenah, Reese [[id number]]  Epidural    Kaiyah, Dimatteo Girl DANALYNN BERHOW [[id number]]  Epidural  Labor Complications:    Beverlie, Crest [[id number]]      Tristan, Kammerman Girl LEIANN AUTIN [[id number]]       ESTIMATED BLOOD LOSS:    Alainnah, Warne [[id number]]  200    Romania, Fawver Girl KARMA KOPE [[id number]]  200 ml    FINDINGS:   1) female infant, Apgar scores of    Evangelynn, Zivkovich [[id number]]  8    Khrystina, Alsbrook [[id number]]  7   at 1 minute and    Hyacinth, Pati [[id number]]  [address] Girl MARIESSA ELZEY [[id number]]  9   at 5 minutes and a birthweight of    Adriene, Mensing [[id number]]  74.78    Meela, Manalili Girl ZAARA DICKHAUT [[id number]]  75.13 ounces.     SPECIMENS:   PLACENTA:   Appearance:    Randilee, Rech [[id number]]  Intact    Ionia, Mleczko [[id number]]  Intact   Removal:    Maysun, Wheelus [[id number]]  Spontaneous    Nekeshia, Brammer [[id number]]  Spontaneous     Disposition:    Janera, Brien [[id number]]      Tany, Rueb Girl SHUKRONA LOPIANO [[id number]]     CORD BLOOD A: Discarded  CORD BLOOD B: Discarded  DISPOSITION:  Infant A left in stable condition in the delivery room, with L&D personnel and mother.  Infant B left in stable condition in the delivery room, with L&D personnel and mother.  NARRATIVE  SUMMARY: Labor course:  Vanessa Francis is a [id number] at [redacted]w[redacted]d who presented to Labor & Delivery for induction of labor. Her initial cervical exam was 3/60/-2. Labor proceeded with pitocin induction. She received an epidural with excellent pain management. She was found to be completely dilated at 1843.  Dr. Valentino Saxon present in room for entirety of 2nd & 3rd stage. With excellent maternal pushing effort, she birthed a viable female infant at 22. There was not a nuchal cord. The shoulders were birthed without difficulty. The infant was placed skin-to-skin with mom. The cord was doubly clamped and cut after approximately of life. Bedside ultrasound confirmed vertex presentation of twin B. Fetal station initially -4, with BBOW. Continued to push with contractions with descent of fetal head to 0 station, AROM for moderate amount clear fluid. Over 2 contractions head brought to perineum & infant delivered over intact perineum. There was no nuchal cord. Cord doubly clamped and cut after approximately of life.  The placentas delivered spontaneously and were noted to be intact with a 3VC for both cords. Placentas to pathology for due to fetal growth restriction in pregnancy. Cord A marked with one clamp, Cord B with two clamps. A perineal and vaginal examination was performed. Episiotomy/Lacerations:  Jaquita, Mossholder [[id number]]  None    Hannalee, Diddle Girl AHYANNA HEMMERLING [[id number]]  None The patient tolerated this well. Mother and baby were left in stable condition.  Dr. Valentino Saxon was present and available for entirety of delivery.  Dominica Severin, CNM 12/23/2022 5:14 AM
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
## 2022-06-02 ENCOUNTER — Other Ambulatory Visit: Payer: Self-pay

## 2022-06-02 ENCOUNTER — Emergency Department
Admission: EM | Admit: 2022-06-02 | Discharge: 2022-06-02 | Disposition: A | Discharge status: Home / Self Care | Payer: Self-pay | Attending: Emergency Medicine | Admitting: Emergency Medicine

## 2022-06-02 ENCOUNTER — Encounter: Payer: Self-pay | Admitting: Emergency Medicine

## 2022-06-02 DIAGNOSIS — Z3201 Encounter for pregnancy test, result positive: Secondary | ICD-10-CM | POA: Insufficient documentation

## 2022-06-02 DIAGNOSIS — K047 Periapical abscess without sinus: Secondary | ICD-10-CM | POA: Insufficient documentation

## 2022-06-02 DIAGNOSIS — Z331 Pregnant state, incidental: Secondary | ICD-10-CM

## 2022-06-02 LAB — POC URINE PREG, ED: Preg Test, Ur: POSITIVE — AB

## 2022-06-02 MED ORDER — AMOXICILLIN 875 MG PO TABS: 875.0000 mg | ORAL_TABLET | Freq: Two times a day (BID) | ORAL | 0 refills | Status: DC

## 2022-06-02 NOTE — ED Provider Notes (Signed)
[facility] Provider Note    Event Date/Time   First MD Initiated Contact with Patient 06/02/22 1003     (approximate)   History   Dental Pain   HPI  Vanessa Francis is a 32 y.o. female   presents to the ED with complaint of dental pain that started 2 days ago.  Patient has ongoing dental issues and has not seen a dentist.      Physical Exam   Triage Vital Signs: ED Triage Vitals  Enc Vitals Group     BP 06/02/22 0937 123/87     Pulse Rate 06/02/22 0937 83     Resp 06/02/22 0937 18     Temp 06/02/22 0937 98.4 F (36.9 C)     Temp Source 06/02/22 0937 Oral     SpO2 06/02/22 0937 99 %     Weight 06/02/22 0943 200 lb (90.7 kg)     Height 06/02/22 0943 '5\' 5"'$  (1.651 m)     Head Circumference --      Peak Flow --      Pain Score 06/02/22 0943 10     Pain Loc --      Pain Edu? --      Excl. in [place]? --     Most recent vital signs: Vitals:   06/02/22 0937  BP: 123/87  Pulse: 83  Resp: 18  Temp: 98.4 F (36.9 C)  SpO2: 99%     General: Awake, no distress.  CV:  Good peripheral perfusion.  Resp:  Normal effort.  Abd:  No distention.  Other:  Left upper pre and posterior molars are sensitive and gum is edematous and tender to palpation with a tongue depressor.  No active drainage is noted.   ED Results / Procedures / Treatments   Labs (all labs ordered are listed, but only abnormal results are displayed) Labs Reviewed  POC URINE PREG, ED - Abnormal; Notable for the following components:      Result Value   Preg Test, Ur Positive (*)    All other components within normal limits      PROCEDURES:  Critical Care performed:   Procedures   MEDICATIONS ORDERED IN ED: Medications - No data to display   IMPRESSION / MDM / [place] / ED COURSE  I reviewed the triage vital signs and the nursing notes.   Differential diagnosis includes, but is not limited to, gingivitis, dental caries, dental abscess, LMP 04/22/2022  rule out pregnancy.  32 year old female presents to the ED with complaint of dental pain for which she has been unable to see a dentist.  It was also noted during her exam that her  last LMP was 04/22/2022.  Urine pregnancy test was positive and patient was made aware that she should remain on Tylenol and no anti-inflammatories.  A prescription for amoxicillin was sent to the pharmacy for her to begin taking.  Also a list of dental clinics was provided to her for future dental intervention.  Patient plans to follow-up with [place] prenatal clinic for her pregnancy.      Patient's presentation is most consistent with acute complicated illness / injury requiring diagnostic workup.  FINAL CLINICAL IMPRESSION(S) / ED DIAGNOSES   Final diagnoses:  Dental abscess  Pregnancy as incidental finding     Rx / DC Orders   ED Discharge Orders          Ordered    amoxicillin (AMOXIL) 875 MG tablet  2 times daily        06/02/22 1047             Note:  This document was prepared using Dragon voice recognition software and may include unintentional dictation errors.   [name], PA-C 06/02/22 1611    Carrie Mew, MD 06/03/22 [phone]

## 2022-06-02 NOTE — Discharge Instructions (Addendum)
Call one of the dentist listed on your discharge papers to make an appointment for dental care.  Also your pregnancy test is positive and you should make arrangements to be seen either at the [facility] or Dr. Ouida Sills who is at [facility] for prenatal care.  Do not take any other anti-inflammatory such as ibuprofen, Aleve, naproxen.  It is safe for you to take Tylenol if needed for pain.  The antibiotic prescribed for you is safe in pregnancy and should be taken twice a day until completely finished.

## 2022-06-02 NOTE — ED Triage Notes (Signed)
Pt via POV from home. Pt c/o L upper dental pain. Pt states she has ongoing dental issues but unable to get the tooth pulled. Pt is A&Ox4 and NAD. Ambulatory to triage.

## 2022-06-17 ENCOUNTER — Telehealth: Payer: Self-pay | Admitting: Obstetrics and Gynecology

## 2022-06-17 ENCOUNTER — Ambulatory Visit: Payer: Self-pay

## 2022-06-17 NOTE — Telephone Encounter (Signed)
Reached out to pt to reschedule New OB intake nurse appt that was scheduled on 06/17/2022 at 11:15.  Left message for pt to call back.

## 2022-06-18 ENCOUNTER — Encounter: Payer: Self-pay | Admitting: Obstetrics and Gynecology

## 2022-06-18 NOTE — Telephone Encounter (Signed)
Reached out to pt (2x) to reschedule New OB Intake nurse appt that was scheduled on 06/17/2022 at 11:15.  Left message for pt to call back.  Will send a MyChart letter.

## 2022-06-26 ENCOUNTER — Other Ambulatory Visit: Payer: Self-pay

## 2022-06-26 ENCOUNTER — Emergency Department
Admission: EM | Admit: 2022-06-26 | Discharge: 2022-06-26 | Disposition: A | Discharge status: Home / Self Care | Payer: Self-pay | Attending: Emergency Medicine | Admitting: Emergency Medicine

## 2022-06-26 DIAGNOSIS — Z3A09 9 weeks gestation of pregnancy: Secondary | ICD-10-CM | POA: Insufficient documentation

## 2022-06-26 DIAGNOSIS — O2391 Unspecified genitourinary tract infection in pregnancy, first trimester: Secondary | ICD-10-CM | POA: Insufficient documentation

## 2022-06-26 DIAGNOSIS — O99611 Diseases of the digestive system complicating pregnancy, first trimester: Secondary | ICD-10-CM | POA: Insufficient documentation

## 2022-06-26 DIAGNOSIS — O99891 Other specified diseases and conditions complicating pregnancy: Secondary | ICD-10-CM

## 2022-06-26 DIAGNOSIS — K047 Periapical abscess without sinus: Secondary | ICD-10-CM | POA: Insufficient documentation

## 2022-06-26 LAB — URINALYSIS, ROUTINE W REFLEX MICROSCOPIC
Bilirubin Urine: NEGATIVE
Glucose, UA: NEGATIVE mg/dL
Hgb urine dipstick: NEGATIVE
Ketones, ur: NEGATIVE mg/dL
Nitrite: POSITIVE — AB
Protein, ur: NEGATIVE mg/dL
Specific Gravity, Urine: 1.026 (ref 1.005–1.030)
pH: 6 (ref 5.0–8.0)

## 2022-06-26 LAB — POC URINE PREG, ED: Preg Test, Ur: POSITIVE — AB

## 2022-06-26 MED ORDER — COMPLETENATE 29-1 MG PO CHEW: 1.0000 | CHEWABLE_TABLET | Freq: Every day | ORAL | 2 refills | Status: AC

## 2022-06-26 MED ORDER — ONDANSETRON 4 MG PO TBDP: 4.0000 mg | ORAL_TABLET | Freq: Three times a day (TID) | ORAL | 0 refills | Status: DC | PRN

## 2022-06-26 MED ORDER — ACETAMINOPHEN 325 MG PO TABS
650.0000 mg | ORAL_TABLET | Freq: Once | ORAL | Status: AC
  Administered 2022-06-26: 650 mg via ORAL
  Filled 2022-06-26: qty 2

## 2022-06-26 MED ORDER — AMOXICILLIN-POT CLAVULANATE 875-125 MG PO TABS
1.0000 | ORAL_TABLET | Freq: Once | ORAL | Status: AC
  Administered 2022-06-26: 1 via ORAL
  Filled 2022-06-26: qty 1

## 2022-06-26 MED ORDER — METOCLOPRAMIDE HCL 10 MG PO TABS
10.0000 mg | ORAL_TABLET | Freq: Once | ORAL | Status: AC
  Administered 2022-06-26: 10 mg via ORAL
  Filled 2022-06-26: qty 1

## 2022-06-26 MED ORDER — AMOXICILLIN 875 MG PO TABS: 875.0000 mg | ORAL_TABLET | Freq: Two times a day (BID) | ORAL | 0 refills | Status: AC

## 2022-06-26 NOTE — ED Provider Notes (Signed)
[facility] Emergency Department Provider Note     Event Date/Time   First MD Initiated Contact with Patient 06/26/22 1704     (approximate)   History   Dental Pain and Migraine   HPI  Vanessa Francis is a 32 y.o. female G3P2, presents to the ED at [redacted] weeks gestation, with concern for dental infection associated headache.  Patient reports broken wisdom tooth and some facial swelling concerning for dental abscess.  Patient denies any pregnancy related complaints at this time.  She is scheduled to see her OB provider for initial intake in 2 weeks.  Physical Exam   Triage Vital Signs: ED Triage Vitals [06/26/22 1508]  Enc Vitals Group     BP (!) 147/87     Pulse Rate 95     Resp 18     Temp 98.4 F (36.9 C)     Temp Source Oral     SpO2 99 %     Weight      Height      Head Circumference      Peak Flow      Pain Score 9     Pain Loc      Pain Edu?      Excl. in [place]?     Most recent vital signs: Vitals:   06/26/22 1508 06/26/22 1841  BP: (!) 147/87 (!) 144/82  Pulse: 95 92  Resp: 18 15  Temp: 98.4 F (36.9 C)   SpO2: 99% 97%    General Awake, no distress. NAD HEENT NCAT.  Some mild flattening of the left nasolabial fold consistent with probable early dental infection.  PERRL. EOMI. No rhinorrhea. Mucous membranes are moist.  Uvula is midline tonsils are flat.  No oropharyngeal lesions appreciated. CV:  Good peripheral perfusion.  RESP:  Normal effort.  ABD:  No distention.   ED Results / Procedures / Treatments   Labs (all labs ordered are listed, but only abnormal results are displayed) Labs Reviewed  URINALYSIS, ROUTINE W REFLEX MICROSCOPIC - Abnormal; Notable for the following components:      Result Value   Color, Urine YELLOW (*)    APPearance HAZY (*)    Nitrite POSITIVE (*)    Leukocytes,Ua TRACE (*)    Bacteria, UA MANY (*)    All other components within normal limits  POC URINE PREG, ED - Abnormal; Notable for the  following components:   Preg Test, Ur Positive (*)    All other components within normal limits  URINE CULTURE     EKG   RADIOLOGY  No results found.   PROCEDURES:  Critical Care performed: No  Procedures   MEDICATIONS ORDERED IN ED: Medications  acetaminophen (TYLENOL) tablet 650 mg (650 mg Oral Given 06/26/22 1756)  metoCLOPramide (REGLAN) tablet 10 mg (10 mg Oral Given 06/26/22 1757)  amoxicillin-clavulanate (AUGMENTIN) 875-125 MG per tablet 1 tablet (1 tablet Oral Given 06/26/22 1757)     IMPRESSION / MDM / ASSESSMENT AND PLAN / ED COURSE  I reviewed the triage vital signs and the nursing notes.                              Differential diagnosis includes, but is not limited to, dental infection, dental abscess, general headache, migraine, asymptomatic bacteriuria  Patient's presentation is most consistent with acute complicated illness / injury requiring diagnostic workup.  Patient's diagnosis is consistent with dental infection and  asymptomatic bacteriuria in first trimester.  Patient presenting for dental infection and headache without pregnancy related complaints at this time.  Patient will be discharged home with prescriptions for Augmentin, Zofran, and prenatal multivitamin. Patient is to follow up with [facility] as scheduled, as needed or otherwise directed. Patient is given ED precautions to return to the ED for any worsening or new symptoms.  FINAL CLINICAL IMPRESSION(S) / ED DIAGNOSES   Final diagnoses:  Dental infection  Asymptomatic bacteriuria during pregnancy in first trimester     Rx / DC Orders   ED Discharge Orders          Ordered    amoxicillin (AMOXIL) 875 MG tablet  2 times daily        06/26/22 1740    prenatal vitamin w/FE, FA (NATACHEW) 29-1 MG CHEW chewable tablet  Daily        06/26/22 1740    ondansetron (ZOFRAN-ODT) 4 MG disintegrating tablet  Every 8 hours PRN        06/26/22 1740              Note:  This document was prepared using Dragon voice recognition software and may include unintentional dictation errors.    Melvenia Needles, PA-C 06/26/22 1856    Rada Hay, MD 06/26/22 2312

## 2022-06-26 NOTE — ED Triage Notes (Addendum)
Pt to ED via POV from home. Pt reports left sided dental pain that has caused a migraine. Pt also reports cough. Pt reports last period was in January and when she was last seen on 2/25 she was told she was pregnant. This would be 3rd pregnancy. Pt was told she could not been seen by OB until [redacted]wks pregnant.

## 2022-06-26 NOTE — Discharge Instructions (Addendum)
Take the antibiotic as prescribed for your dental infections and your urinary tract infection.  The nausea medicine as needed as well as the pre or vitamin daily.  Follow-up with your [facility] provider as scheduled.

## 2022-06-28 LAB — URINE CULTURE: Culture: >=100000 — AB

## 2022-07-08 ENCOUNTER — Telehealth: Payer: Self-pay | Admitting: Obstetrics and Gynecology

## 2022-07-08 ENCOUNTER — Other Ambulatory Visit: Payer: Self-pay

## 2022-07-08 NOTE — Telephone Encounter (Signed)
Reached out to pt to reschedule NOB lab appt that was scheduled for 07/08/2022 at 3:00.  Was able to get appt rescheduled for 07/09/2022 at 3:00.  (Pt thought that we were closed.)

## 2022-07-09 ENCOUNTER — Other Ambulatory Visit: Payer: Self-pay

## 2022-07-09 NOTE — Telephone Encounter (Signed)
I contacted the patient via phone. I left message for the patient to call back due to never completing her NOB intake. No labs have been ordered. The patient is scheduled for NOB labs this afternoon at 3 pm.

## 2022-07-09 NOTE — Telephone Encounter (Signed)
This patient contacted the office and is rescheduled for 4/3 for NOB intake and NOB labs.

## 2022-07-10 ENCOUNTER — Other Ambulatory Visit: Payer: Self-pay

## 2022-07-10 ENCOUNTER — Ambulatory Visit (INDEPENDENT_AMBULATORY_CARE_PROVIDER_SITE_OTHER): Payer: Self-pay

## 2022-07-10 ENCOUNTER — Other Ambulatory Visit: Payer: Self-pay | Admitting: Obstetrics and Gynecology

## 2022-07-10 ENCOUNTER — Encounter: Payer: Self-pay | Admitting: Obstetrics and Gynecology

## 2022-07-10 ENCOUNTER — Other Ambulatory Visit (HOSPITAL_COMMUNITY)
Admission: RE | Admit: 2022-07-10 | Discharge: 2022-07-10 | Disposition: A | Discharge status: Home / Self Care | Payer: Self-pay | Source: Ambulatory Visit | Attending: Obstetrics and Gynecology | Admitting: Obstetrics and Gynecology

## 2022-07-10 DIAGNOSIS — Z3687 Encounter for antenatal screening for uncertain dates: Secondary | ICD-10-CM

## 2022-07-10 DIAGNOSIS — Z130 Encounter for screening for diseases of the blood and blood-forming organs and certain disorders involving the immune mechanism: Secondary | ICD-10-CM

## 2022-07-10 DIAGNOSIS — Z131 Encounter for screening for diabetes mellitus: Secondary | ICD-10-CM

## 2022-07-10 DIAGNOSIS — O30001 Twin pregnancy, unspecified number of placenta and unspecified number of amniotic sacs, first trimester: Secondary | ICD-10-CM

## 2022-07-10 DIAGNOSIS — Z369 Encounter for antenatal screening, unspecified: Secondary | ICD-10-CM

## 2022-07-10 DIAGNOSIS — Z3480 Encounter for supervision of other normal pregnancy, unspecified trimester: Secondary | ICD-10-CM

## 2022-07-10 DIAGNOSIS — Z3401 Encounter for supervision of normal first pregnancy, first trimester: Secondary | ICD-10-CM

## 2022-07-10 DIAGNOSIS — Z1379 Encounter for other screening for genetic and chromosomal anomalies: Secondary | ICD-10-CM

## 2022-07-10 DIAGNOSIS — Z3689 Encounter for other specified antenatal screening: Secondary | ICD-10-CM

## 2022-07-10 DIAGNOSIS — Z3A12 12 weeks gestation of pregnancy: Secondary | ICD-10-CM

## 2022-07-10 HISTORY — DX: Encounter for supervision of other normal pregnancy, unspecified trimester: Z34.80

## 2022-07-10 NOTE — Progress Notes (Signed)
New OB Intake  I connected with  Vanessa Francis on 07/10/22 at  1:15 PM EDT by telephone and verified that I am speaking with the correct person using two identifiers. Nurse is located at [employer] [employer] and pt is located at home.  I explained I am completing New OB Intake today. We discussed her EDD of 01/27/2023 that is based on LMP of 04/22/2022. Pt is G3/P2002. I reviewed her allergies, medications, Medical/Surgical/OB history, and appropriate screenings. There are no cats in the home. Based on history, this is a/an pregnancy uncomplicated .   Patient Active Problem List   Diagnosis Date Noted   Supervision of other normal pregnancy, antepartum 07/10/2022   H/O LEEP 2018 08/03/2020   Obesity BMI=33.7 08/03/2020   Surveillance for Depo-Provera contraception 10/22/2018   History of abnormal cervical Pap smear 05/28/2016    Concerns addressed today None  Delivery Plans:  Plans to deliver at [facility].  Anatomy US Explained first scheduled [place] will be today and an anatomy scan will be done at 20 weeks.  Labs Discussed genetic screening with patient. Patient declined genetic testing.  Discussed possible labs to be drawn at new OB appointment.  COVID Vaccine Patient has not had COVID vaccine.   Social Determinants of Health Food Insecurity: expresses food insecurity. Information given on local food banks. Transportation: Patient denies transportation needs. Childcare: Discussed no children allowed at ultrasound appointments.   First visit review I reviewed new OB appt with pt. I explained she will have ob bloodwork and pap smear/pelvic exam if indicated. Explained pt will be seen by Dr. Jeannie Fend at first visit; encounter routed to appropriate provider.   Cleophas Dunker, [place] 07/10/2022  1:50 PM

## 2022-07-11 ENCOUNTER — Telehealth: Payer: Self-pay

## 2022-07-11 NOTE — Telephone Encounter (Signed)
TC to patient to find out if she plans to keep her new OB appointment at [facility] tomorrow. Patient was seen at [facility] yesterday, and has a return appointment on 07/17/22. Patient also had an U/S yesterday and is having twins. Patient aware that she is having twins and needs to come to [facility] to apply for Medicaid. Patient informed that we will not be able to continue her care here at [facility] as she is too high risk with her twins. Unable to call [facility] at 6:30 pm and not sure whether she can keep her appointment on 07/17/22 due to not yet having Medicaid. She has had Medicaid in the past with previous pregnancies. Plan to call [facility] on 07/12/22 to make sure they will see patient on 07/17/22 if her Medicaid is not yet active. [facility] appointment left on the schedule for now.Jenetta Downer, RN

## 2022-07-12 ENCOUNTER — Telehealth: Payer: Self-pay

## 2022-07-12 LAB — CBC/D/PLT+RPR+RH+ABO+RUBIGG...
Antibody Screen: NEGATIVE
Basophils Absolute: 0 10*3/uL (ref 0.0–0.2)
Basos: 0 %
EOS (ABSOLUTE): 0.1 10*3/uL (ref 0.0–0.4)
Eos: 1 %
HCV Ab: NONREACTIVE
HIV Screen 4th Generation wRfx: NONREACTIVE
Hematocrit: 39.1 % (ref 34.0–46.6)
Hemoglobin: 12.6 g/dL (ref 11.1–15.9)
Hepatitis B Surface Ag: NEGATIVE
Immature Grans (Abs): 0 10*3/uL (ref 0.0–0.1)
Immature Granulocytes: 0 %
Lymphocytes Absolute: 2.9 10*3/uL (ref 0.7–3.1)
Lymphs: 23 %
MCH: 26.9 pg (ref 26.6–33.0)
MCHC: 32.2 g/dL (ref 31.5–35.7)
MCV: 84 fL (ref 79–97)
Monocytes Absolute: 0.8 10*3/uL (ref 0.1–0.9)
Monocytes: 7 %
Neutrophils Absolute: 8.5 10*3/uL — ABNORMAL HIGH (ref 1.4–7.0)
Neutrophils: 69 %
Platelets: 158 10*3/uL (ref 150–450)
RBC: 4.68 x10E6/uL (ref 3.77–5.28)
RDW: 13.2 % (ref 11.7–15.4)
RPR Ser Ql: NONREACTIVE
Rh Factor: POSITIVE
Rubella Antibodies, IGG: 2.75 index (ref >0.99)
Varicella zoster IgG: 1926 index (ref >165)
WBC: 12.3 10*3/uL — ABNORMAL HIGH (ref 3.4–10.8)

## 2022-07-12 LAB — HGB FRACTIONATION CASCADE
Hgb A2: 2.6 % (ref 1.8–3.2)
Hgb A: 97.4 % (ref 96.4–98.8)
Hgb F: 0 % (ref 0.0–2.0)
Hgb S: 0 %

## 2022-07-12 LAB — URINE CYTOLOGY ANCILLARY ONLY
Chlamydia: NEGATIVE
Comment: NEGATIVE
Comment: NORMAL
Neisseria Gonorrhea: NEGATIVE

## 2022-07-12 LAB — CULTURE, OB URINE

## 2022-07-12 LAB — URINE CULTURE, OB REFLEX

## 2022-07-12 LAB — HCV INTERPRETATION

## 2022-07-12 LAB — HEMOGLOBIN A1C
Est. average glucose Bld gHb Est-mCnc: 111 mg/dL
Hgb A1c MFr Bld: 5.5 % (ref 4.8–5.6)

## 2022-07-12 NOTE — Telephone Encounter (Signed)
See comments  

## 2022-07-15 LAB — MATERNIT 21 PLUS CORE, BLOOD
Fetal Fraction: 10
Result (T21): NEGATIVE
Trisomy 13 (Patau syndrome): NEGATIVE
Trisomy 18 (Edwards syndrome): NEGATIVE
Trisomy 21 (Down syndrome): NEGATIVE

## 2022-07-17 ENCOUNTER — Encounter: Payer: Self-pay | Admitting: Obstetrics and Gynecology

## 2022-07-17 ENCOUNTER — Other Ambulatory Visit (HOSPITAL_COMMUNITY)
Admission: RE | Admit: 2022-07-17 | Discharge: 2022-07-17 | Disposition: A | Discharge status: Home / Self Care | Payer: Self-pay | Source: Ambulatory Visit | Attending: Obstetrics and Gynecology | Admitting: Obstetrics and Gynecology

## 2022-07-17 ENCOUNTER — Ambulatory Visit (INDEPENDENT_AMBULATORY_CARE_PROVIDER_SITE_OTHER): Payer: Self-pay | Admitting: Obstetrics and Gynecology

## 2022-07-17 VITALS — BP 123/85 | HR 91 | Wt 193.9 lb

## 2022-07-17 DIAGNOSIS — Z369 Encounter for antenatal screening, unspecified: Secondary | ICD-10-CM

## 2022-07-17 DIAGNOSIS — O30001 Twin pregnancy, unspecified number of placenta and unspecified number of amniotic sacs, first trimester: Secondary | ICD-10-CM

## 2022-07-17 DIAGNOSIS — Z3A13 13 weeks gestation of pregnancy: Secondary | ICD-10-CM

## 2022-07-17 DIAGNOSIS — Z3480 Encounter for supervision of other normal pregnancy, unspecified trimester: Secondary | ICD-10-CM

## 2022-07-17 DIAGNOSIS — O0992 Supervision of high risk pregnancy, unspecified, second trimester: Secondary | ICD-10-CM

## 2022-07-17 DIAGNOSIS — Z124 Encounter for screening for malignant neoplasm of cervix: Secondary | ICD-10-CM | POA: Insufficient documentation

## 2022-07-17 DIAGNOSIS — Z1379 Encounter for other screening for genetic and chromosomal anomalies: Secondary | ICD-10-CM

## 2022-07-17 DIAGNOSIS — O30002 Twin pregnancy, unspecified number of placenta and unspecified number of amniotic sacs, second trimester: Secondary | ICD-10-CM

## 2022-07-17 NOTE — Progress Notes (Signed)
Patient presents today for New OB physical. She states nausea has resolved.Due for her pap smear. She wants genetic testing, Materniti21 ordered.  Patient states no other questions or concerns at this time.

## 2022-07-17 NOTE — Addendum Note (Signed)
Addended by: Georgiana Shore R on: 07/17/2022 11:00 AM   Modules accepted: Orders

## 2022-07-17 NOTE — Progress Notes (Signed)
NOB: Has run out of prenatal vitamins-refill of prescription or over-the-counter encouraged.  Patient to begin ASA.  MaterniT 21 today.  aFP next visit.  Pap performed today.  Physical examination General NAD, Conversant  HEENT Atraumatic; Op clear with mmm.  Normo-cephalic. Pupils reactive. Anicteric sclerae  Thyroid/Neck Smooth without nodularity or enlargement. Normal ROM.  Neck Supple.  Skin No rashes, lesions or ulceration. Normal palpated skin turgor. No nodularity.  Breasts: No masses or discharge.  Symmetric.  No axillary adenopathy.  Lungs: Clear to auscultation.No rales or wheezes. Normal Respiratory effort, no retractions.  Heart: NSR.  No murmurs or rubs appreciated. No peripheral edema  Abdomen: Soft.  Non-tender.  No masses.  No HSM. No hernia  Extremities: Moves all appropriately.  Normal ROM for age. No lymphadenopathy.  Neuro: Oriented to PPT.  Normal mood. Normal affect.     Pelvic:   Vulva: Normal appearance.  No lesions.  Vagina: No lesions or abnormalities noted.  Support: Normal pelvic support.  Urethra No masses tenderness or scarring.  Meatus Normal size without lesions or prolapse.  Cervix: Normal appearance.  No lesions.  Anus: Normal exam.  No lesions.  Perineum: Normal exam.  No lesions.        Bimanual   Adnexae: No masses.  Non-tender to palpation.  Uterus: Enlarged. 156bmp  Non-tender.  Mobile.  AV.  Adnexae: No masses.  Non-tender to palpation.  Cul-de-sac: Negative for abnormality.  Adnexae: No masses.  Non-tender to palpation.         Pelvimetry   Diagonal: Reached.  Spines: Average.  Sacrum: Concave.  Pubic Arch: Normal.

## 2022-07-18 LAB — URINALYSIS, ROUTINE W REFLEX MICROSCOPIC
Bilirubin, UA: NEGATIVE
Glucose, UA: NEGATIVE
Ketones, UA: NEGATIVE
Leukocytes,UA: NEGATIVE
Nitrite, UA: POSITIVE — AB
RBC, UA: NEGATIVE
Specific Gravity, UA: >=1.03 — AB (ref 1.005–1.030)
Urobilinogen, Ur: 0.2 mg/dL (ref 0.2–1.0)
pH, UA: 6 (ref 5.0–7.5)

## 2022-07-18 LAB — MICROSCOPIC EXAMINATION
Casts: NONE SEEN /lpf
RBC, Urine: NONE SEEN /hpf (ref 0–2)
WBC, UA: NONE SEEN /hpf (ref 0–5)

## 2022-07-22 LAB — MATERNIT 21 PLUS CORE, BLOOD

## 2022-07-23 LAB — CYTOLOGY - PAP
Comment: NEGATIVE
Diagnosis: NEGATIVE
High risk HPV: NEGATIVE

## 2022-07-26 LAB — MONITOR DRUG PROFILE 14(MW)

## 2022-07-26 LAB — NICOTINE SCREEN, URINE

## 2022-08-14 ENCOUNTER — Encounter: Payer: Self-pay | Admitting: Licensed Practical Nurse

## 2022-08-14 ENCOUNTER — Ambulatory Visit (INDEPENDENT_AMBULATORY_CARE_PROVIDER_SITE_OTHER): Payer: Medicaid Other | Admitting: Licensed Practical Nurse

## 2022-08-14 VITALS — BP 134/83 | HR 90 | Wt 195.0 lb

## 2022-08-14 DIAGNOSIS — Z3A17 17 weeks gestation of pregnancy: Secondary | ICD-10-CM

## 2022-08-14 DIAGNOSIS — Z3480 Encounter for supervision of other normal pregnancy, unspecified trimester: Secondary | ICD-10-CM

## 2022-08-14 DIAGNOSIS — O30032 Twin pregnancy, monochorionic/diamniotic, second trimester: Secondary | ICD-10-CM

## 2022-08-14 DIAGNOSIS — O0992 Supervision of high risk pregnancy, unspecified, second trimester: Secondary | ICD-10-CM

## 2022-08-14 NOTE — Progress Notes (Signed)
Routine Prenatal Care Visit  Subjective  Vanessa Francis is a 32 y.o. G3P2002 at [redacted]w[redacted]d being seen today for ongoing prenatal care.  She is currently monitored for the following issues for this high-risk pregnancy and has History of abnormal cervical Pap smear; Surveillance for Depo-Provera contraception; H/O LEEP 2018; Obesity BMI=33.7; and Supervision of other normal pregnancy, antepartum on their problem list.  ----------------------------------------------------------------------------------- Patient reports fatigue.  Here with Jill Alexanders Feeling good, just tired TWG -5, has a low appetite, does eat small frequent meals, trying her best to stay hydrated  Starting to get baby items, has  9 and 7 year olds at home  Contractions: Not present. Vag. Bleeding: None.  Movement: Present. Leaking Fluid denies.  ----------------------------------------------------------------------------------- The following portions of the patient's history were reviewed and updated as appropriate: allergies, current medications, past family history, past medical history, past social history, past surgical history and problem list. Problem list updated.  Objective  Blood pressure 134/83, pulse 90, weight 195 lb (88.5 kg), last menstrual period 04/22/2022. Pregravid weight 200 lb (90.7 kg) Total Weight Gain -5 lb (-2.268 kg) Urinalysis: Urine Protein    Urine Glucose    Fetal Status: Fetal Heart Rate (bpm): 155/145   Movement: Present     General:  Alert, oriented and cooperative. Patient is in no acute distress.  Skin: Skin is warm and dry. No rash noted.   Cardiovascular: Normal heart rate noted  Respiratory: Normal respiratory effort, no problems with respiration noted  Abdomen: Soft, gravid, appropriate for gestational age. Pain/Pressure: Absent     Pelvic:  Cervical exam deferred        Extremities: Normal range of motion.     Mental Status: Normal mood and affect. Normal behavior. Normal judgment and thought  content.   Assessment   32 y.o. [id number] at [redacted]w[redacted]d by  01/19/2023, by Ultrasound presenting for routine prenatal visit  Plan   third Problems (from 07/10/22 to present)     Problem Noted Resolved   Supervision of other normal pregnancy, antepartum 07/10/2022 by Loran Senters, CMA No   Overview Addendum 08/14/2022  4:49 PM by Ellwood Sayers, CNM     Clinical Staff Provider  Office Location  [facility] Dating  12wk [place], mono-di twins   Language  English Anatomy US    Flu Vaccine  offer Genetic Screen  NIPS: neg  TDaP vaccine   offer Hgb A1C or  GTT Early : Third trimester :   Covid declined   LAB RESULTS   Rhogam  B/Positive/-- (04/03 1556)  Blood Type B/Positive/-- (04/03 1556)   Feeding Plan breast Antibody Negative (04/03 1556)  Contraception depo Rubella 2.75 (04/03 1556)  Circumcision yes RPR Non Reactive (04/03 1556)   Pediatrician  Phineas Real HBsAg Negative (04/03 1556)   Support Person Jill Alexanders, Mom HIV Non Reactive (04/03 1556)  Prenatal Classes no Varicella Immune    GBS  (For PCN allergy, check sensitivities)   BTL Consent  Hep C Non Reactive (04/03 1556)   VBAC Consent  Pap Diagnosis  Date Value Ref Range Status  07/17/2022   Final   - Negative for intraepithelial lesion or malignancy (NILM)      Hgb Electro      CF      SMA        Monochorionic Diamniotic Twin Pregnancy Plan  Mom should take 60-120mg  of elemental iron a day as well as 1mg  of folic acid  11-14 wks: Evaluate for early growth discordance between  16  wks: Begin Ultrasounds for TTTS checks every 2 weeks (monitor MVP* for each twin and visualize the bladder) 18-20 wks:  Anatomy Ultrasound 22 wks: Fetal Echo 20-Delivery: Growth Ultrasounds every 2-4 weeks** 32 wks: Initiate NSTs  Delivery No complications: 34 0/7 - 37 6/7 Isolated IUGR: 32 0/7- 34 6/7  Genetic screening offered with first trimester screen (11-14 wks) or CVS sampling (10-12 wks)  Risks: TTTS complicates up to 10-15% of  monochorionic pregnancies TAPS complicates 3-5% of monochorionic pregnancies TRAP complicates 1% of monochorionic pregnancies A fetal demise is one twin results in a 40-50% risk of death of neurologic handicap in the surviving twin There is a ninefold increased risk of congenital heart defects, 4-5%. Normal risk in 0.5%. Perinatal loss and handicap rate is 3-5 times higher than a dichorionic pregnancy  *MVP for either twin > 8cm or < 2cm should result in MFM referral Weekly [place] if issues with MVP arise  **IUGR or Growth discordance > 20% should prompt MFM consultation CALCULATOR: http://perinatology.com/calculators/Twin%20Discordance.htm           Preterm labor symptoms and general obstetric precautions including but not limited to vaginal bleeding, contractions, leaking of fluid and fetal movement were reviewed in detail with the patient. Please refer to After Visit Summary for other counseling recommendations.   Return in about 4 weeks (around 09/11/2022) for ROB.  Referral to MFM made   Jannifer Hick   [facility]  08/14/22  4:51 PM

## 2022-08-15 NOTE — Addendum Note (Signed)
Addended by: Kathlene Cote on: 08/15/2022 08:56 AM   Modules accepted: Orders

## 2022-08-20 ENCOUNTER — Telehealth: Payer: Self-pay | Admitting: Obstetrics

## 2022-08-20 DIAGNOSIS — O30032 Twin pregnancy, monochorionic/diamniotic, second trimester: Secondary | ICD-10-CM

## 2022-08-20 DIAGNOSIS — O30009 Twin pregnancy, unspecified number of placenta and unspecified number of amniotic sacs, unspecified trimester: Secondary | ICD-10-CM

## 2022-08-20 NOTE — Telephone Encounter (Signed)
The patient is returning missed call from Crystal. Please advise? The patient is aware Crystal is in a meeting and will call the patient back.

## 2022-08-20 NOTE — Telephone Encounter (Signed)
Spoke with patient. Advised due to MFM not having availability until 6/19, her anatomy has been scheduled in our office for 5/24. She understands that she needs to keep all appointments 5/24 AOB anatomy, 6/5 AOB Routine Prenatal, 6/19 MFM anatomy. She will also be scheduled for a fetal echo @~22 weeks around the same time as her MFM anatomy. Patient requests midday/afternoon appointment. Orders placed.

## 2022-08-20 NOTE — Telephone Encounter (Signed)
Patient returned call

## 2022-08-20 NOTE — Telephone Encounter (Signed)
Left voicemail to return call to discuss/clarify u/s appointments.

## 2022-08-29 ENCOUNTER — Encounter: Payer: Self-pay | Admitting: Obstetrics and Gynecology

## 2022-08-29 ENCOUNTER — Ambulatory Visit (INDEPENDENT_AMBULATORY_CARE_PROVIDER_SITE_OTHER): Payer: Medicaid Other

## 2022-08-29 DIAGNOSIS — O30009 Twin pregnancy, unspecified number of placenta and unspecified number of amniotic sacs, unspecified trimester: Secondary | ICD-10-CM

## 2022-08-29 DIAGNOSIS — Z3A19 19 weeks gestation of pregnancy: Secondary | ICD-10-CM

## 2022-08-29 DIAGNOSIS — O30032 Twin pregnancy, monochorionic/diamniotic, second trimester: Secondary | ICD-10-CM

## 2022-08-30 ENCOUNTER — Ambulatory Visit: Payer: Medicaid Other

## 2022-08-30 ENCOUNTER — Other Ambulatory Visit: Payer: Medicaid Other

## 2022-09-10 ENCOUNTER — Encounter: Payer: Self-pay | Admitting: Obstetrics

## 2022-09-10 DIAGNOSIS — O0990 Supervision of high risk pregnancy, unspecified, unspecified trimester: Secondary | ICD-10-CM

## 2022-09-10 DIAGNOSIS — O30049 Twin pregnancy, dichorionic/diamniotic, unspecified trimester: Secondary | ICD-10-CM

## 2022-09-10 HISTORY — DX: Supervision of high risk pregnancy, unspecified, unspecified trimester: O09.90

## 2022-09-10 HISTORY — DX: Twin pregnancy, dichorionic/diamniotic, unspecified trimester: O30.049

## 2022-09-11 ENCOUNTER — Ambulatory Visit (INDEPENDENT_AMBULATORY_CARE_PROVIDER_SITE_OTHER): Payer: Medicaid Other | Admitting: Obstetrics

## 2022-09-11 VITALS — BP 100/66 | HR 80 | Wt 198.0 lb

## 2022-09-11 DIAGNOSIS — Z3A21 21 weeks gestation of pregnancy: Secondary | ICD-10-CM

## 2022-09-11 DIAGNOSIS — O30042 Twin pregnancy, dichorionic/diamniotic, second trimester: Secondary | ICD-10-CM

## 2022-09-11 DIAGNOSIS — O0990 Supervision of high risk pregnancy, unspecified, unspecified trimester: Secondary | ICD-10-CM

## 2022-09-11 LAB — POCT URINALYSIS DIPSTICK OB
Bilirubin, UA: NEGATIVE
Blood, UA: NEGATIVE
Glucose, UA: NEGATIVE
Ketones, UA: NEGATIVE
Nitrite, UA: POSITIVE
Spec Grav, UA: 1.025 (ref 1.010–1.025)
Urobilinogen, UA: 0.2 E.U./dL
pH, UA: 6 (ref 5.0–8.0)

## 2022-09-11 NOTE — Progress Notes (Signed)
   Provider: Glenetta Borg, CNM    Return Prenatal Note   Assessment/Plan   Plan  32 y.o. [phone] at [redacted]w[redacted]d with a di-di twin pregnancy presents for follow-up OB visit. Reviewed prenatal record including previous visit note.  Twin gestation, dichorionic diamniotic -Follow up anatomy scan tomorrow -Serial growth scans at 24 weeks -Encouraged increasing dietary iron and protein    Orders Placed This Encounter  Procedures   Culture, OB Urine   US OB Follow Up    Standing Status:   Future    Standing Expiration Date:   09/11/2023    Order Specific Question:   Reason for Exam (SYMPTOM  OR DIAGNOSIS REQUIRED)    Answer:   twin anatomy    Order Specific Question:   Preferred Imaging Location?    Answer:   Internal   US OB Follow Up AddL Gest    Standing Status:   Future    Standing Expiration Date:   09/11/2023    Order Specific Question:   Reason for Exam (SYMPTOM  OR DIAGNOSIS REQUIRED)    Answer:   twin anatomy    Order Specific Question:   Preferred Imaging Location?    Answer:   Internal   US OB Follow Up    Standing Status:   Future    Standing Expiration Date:   09/11/2023    Order Specific Question:   Reason for Exam (SYMPTOM  OR DIAGNOSIS REQUIRED)    Answer:   growth    Order Specific Question:   Preferred Imaging Location?    Answer:   Internal   US OB Follow Up AddL Gest    Standing Status:   Future    Standing Expiration Date:   09/11/2023    Order Specific Question:   Reason for Exam (SYMPTOM  OR DIAGNOSIS REQUIRED)    Answer:   growth    Order Specific Question:   Preferred Imaging Location?    Answer:   Internal   POC Urinalysis Dipstick OB   Return in about 4 weeks (around 10/09/2022).   Future Appointments  Date Time Provider Department Center  09/12/2022  8:15 AM [facility]-AOB [place] 1 AOB-IMG None  10/09/2022  3:15 PM Tyrice Hewitt, Adan Sis, CNM AOB-AOB None    For next visit:  -Growth scans at 24 weeks    Subjective   31 y.o. [id number] at [redacted]w[redacted]d presents for this  follow-up prenatal visit.  Edson Snowball is feeling well. She is taking her prenatal vitamin and ASA. 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 She has good support from family to help once the babies are born. We discussed the different risks of twin pregnancy and the typical prenatal care. She has a f/u US tomorrow for incomplete anatomy.  Patient reports: Movement: Present Contractions: Not present  Objective   Flow sheet Vitals: Pulse Rate: 80 BP: 100/66 Fundal Height: 29 cm Fetal Heart Rate (bpm): 142/152 Total weight gain: -2 lb (-0.907 kg)  General Appearance  No acute distress, well appearing, and well nourished Pulmonary   Normal work of breathing Neurologic   Alert and oriented to person, place, and time Psychiatric   Mood and affect within normal limits

## 2022-09-11 NOTE — Assessment & Plan Note (Signed)
-  Follow up anatomy scan tomorrow -Serial growth scans at 24 weeks -Encouraged increasing dietary iron and protein

## 2022-09-12 ENCOUNTER — Ambulatory Visit (INDEPENDENT_AMBULATORY_CARE_PROVIDER_SITE_OTHER): Payer: Medicaid Other

## 2022-09-12 DIAGNOSIS — Z3A21 21 weeks gestation of pregnancy: Secondary | ICD-10-CM | POA: Diagnosis not present

## 2022-09-12 DIAGNOSIS — O30042 Twin pregnancy, dichorionic/diamniotic, second trimester: Secondary | ICD-10-CM

## 2022-09-12 DIAGNOSIS — O0990 Supervision of high risk pregnancy, unspecified, unspecified trimester: Secondary | ICD-10-CM

## 2022-09-17 ENCOUNTER — Encounter: Payer: Self-pay | Admitting: Obstetrics

## 2022-09-17 LAB — CULTURE, OB URINE

## 2022-09-17 LAB — URINE CULTURE, OB REFLEX

## 2022-09-25 ENCOUNTER — Other Ambulatory Visit: Payer: Medicaid Other

## 2022-10-03 ENCOUNTER — Ambulatory Visit: Payer: Medicaid Other

## 2022-10-03 NOTE — Addendum Note (Signed)
Addended by: Kathlene Cote on: 10/03/2022 03:39 PM   Modules accepted: Orders

## 2022-10-09 ENCOUNTER — Ambulatory Visit (INDEPENDENT_AMBULATORY_CARE_PROVIDER_SITE_OTHER): Payer: Medicaid Other | Admitting: Obstetrics

## 2022-10-09 ENCOUNTER — Encounter: Payer: Medicaid Other | Admitting: Obstetrics

## 2022-10-09 ENCOUNTER — Encounter: Payer: Self-pay | Admitting: Obstetrics

## 2022-10-09 VITALS — BP 120/81 | HR 91 | Wt 197.0 lb

## 2022-10-09 DIAGNOSIS — O30042 Twin pregnancy, dichorionic/diamniotic, second trimester: Secondary | ICD-10-CM

## 2022-10-09 DIAGNOSIS — Z3A25 25 weeks gestation of pregnancy: Secondary | ICD-10-CM

## 2022-10-09 DIAGNOSIS — O0990 Supervision of high risk pregnancy, unspecified, unspecified trimester: Secondary | ICD-10-CM

## 2022-10-09 NOTE — Assessment & Plan Note (Signed)
-  Reviewed adequate hydration and nutrition -Preparing for babies at home -Discussed 1-hour glucose, CBC, RPR at next visit

## 2022-10-09 NOTE — Progress Notes (Signed)
    Return Prenatal Note   Assessment/Plan   Plan  32 y.o. G3P2002 at [redacted]w[redacted]d presents for follow-up OB visit. Reviewed prenatal record including previous visit note.  Twin gestation, dichorionic diamniotic -Anatomy scan complete/normal -Had to reschedule growth scan. Will see MFM 10/24/22  Supervision of high risk pregnancy, antepartum -Reviewed adequate hydration and nutrition -Preparing for babies at home -Discussed 1-hour glucose, CBC, RPR at next visit    No orders of the defined types were placed in this encounter.  Return in about 3 weeks (around 10/30/2022).   Future Appointments  Date Time Provider Department Center  10/24/2022 12:30 PM [facility] NURSE [facility]  10/24/2022 12:45 PM [facility] US6 [facility]  10/31/2022  2:20 PM AOB-OBGYN LAB AOB-AOB None  10/31/2022  3:15 PM Tresea Mall, CNM AOB-AOB None  11/21/2022  1:00 PM AOB-AOB [place] 1 AOB-IMG None    For next visit:  ROB with 1 hour gluocla, third trimester labs, and Tdap     Subjective   31 y.o. [id number] at [redacted]w[redacted]d presents for this follow-up prenatal visit.  Edson Snowball is feeling well. 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 She did feel exhausted after a busy field trip but recovered after rest and hydration. She was late to her growth scan and was not able to be seen. She feels that her appetite has increased.  Patient reports: Movement: Present Contractions: Not present  Objective   Flow sheet Vitals: Pulse Rate: 91 BP: 120/81 Total weight gain: -3 lb (-1.361 kg)  General Appearance  No acute distress, well appearing, and well nourished Pulmonary   Normal work of breathing Neurologic   Alert and oriented to person, place, and time Psychiatric   Mood and affect within normal limits  Glenetta Borg, CNM  10/09/22 1:40 PM

## 2022-10-09 NOTE — Assessment & Plan Note (Signed)
-  Anatomy scan complete/normal -Had to reschedule growth scan. Will see MFM 10/24/22

## 2022-10-18 ENCOUNTER — Encounter: Payer: Self-pay | Admitting: *Deleted

## 2022-10-24 ENCOUNTER — Ambulatory Visit: Payer: Medicaid Other | Admitting: *Deleted

## 2022-10-24 ENCOUNTER — Ambulatory Visit: Payer: Medicaid Other | Attending: Licensed Practical Nurse

## 2022-10-24 VITALS — BP 120/65 | HR 90

## 2022-10-24 DIAGNOSIS — O30043 Twin pregnancy, dichorionic/diamniotic, third trimester: Secondary | ICD-10-CM | POA: Diagnosis present

## 2022-10-24 DIAGNOSIS — O365921 Maternal care for other known or suspected poor fetal growth, second trimester, fetus 1: Secondary | ICD-10-CM | POA: Diagnosis not present

## 2022-10-24 DIAGNOSIS — O0992 Supervision of high risk pregnancy, unspecified, second trimester: Secondary | ICD-10-CM | POA: Diagnosis not present

## 2022-10-24 DIAGNOSIS — O30042 Twin pregnancy, dichorionic/diamniotic, second trimester: Secondary | ICD-10-CM

## 2022-10-24 DIAGNOSIS — O30032 Twin pregnancy, monochorionic/diamniotic, second trimester: Secondary | ICD-10-CM | POA: Insufficient documentation

## 2022-10-24 DIAGNOSIS — O99212 Obesity complicating pregnancy, second trimester: Secondary | ICD-10-CM

## 2022-10-24 DIAGNOSIS — O365922 Maternal care for other known or suspected poor fetal growth, second trimester, fetus 2: Secondary | ICD-10-CM | POA: Diagnosis not present

## 2022-10-24 DIAGNOSIS — Z3A27 27 weeks gestation of pregnancy: Secondary | ICD-10-CM

## 2022-10-24 DIAGNOSIS — E669 Obesity, unspecified: Secondary | ICD-10-CM

## 2022-10-26 ENCOUNTER — Emergency Department
Admission: EM | Admit: 2022-10-26 | Discharge: 2022-10-26 | Disposition: A | Discharge status: Home / Self Care | Payer: Medicaid Other | Attending: Emergency Medicine | Admitting: Emergency Medicine

## 2022-10-26 ENCOUNTER — Other Ambulatory Visit: Payer: Self-pay

## 2022-10-26 ENCOUNTER — Encounter: Payer: Self-pay | Admitting: Emergency Medicine

## 2022-10-26 DIAGNOSIS — O26893 Other specified pregnancy related conditions, third trimester: Secondary | ICD-10-CM | POA: Insufficient documentation

## 2022-10-26 DIAGNOSIS — K029 Dental caries, unspecified: Secondary | ICD-10-CM | POA: Diagnosis not present

## 2022-10-26 DIAGNOSIS — Z3A27 27 weeks gestation of pregnancy: Secondary | ICD-10-CM | POA: Insufficient documentation

## 2022-10-26 MED ORDER — AMOXICILLIN 500 MG PO TABS: 500.0000 mg | ORAL_TABLET | Freq: Three times a day (TID) | ORAL | 0 refills | Status: AC

## 2022-10-26 MED ORDER — LIDOCAINE VISCOUS HCL 2 % MT SOLN: 15.0000 mL | OROMUCOSAL | 0 refills | Status: DC | PRN

## 2022-10-26 MED ORDER — HYDROCODONE-ACETAMINOPHEN 5-325 MG PO TABS
1.0000 | ORAL_TABLET | Freq: Once | ORAL | Status: AC
  Administered 2022-10-26: 1 via ORAL
  Filled 2022-10-26: qty 1

## 2022-10-26 MED ORDER — AMOXICILLIN 500 MG PO CAPS
500.0000 mg | ORAL_CAPSULE | Freq: Once | ORAL | Status: AC
  Administered 2022-10-26: 500 mg via ORAL
  Filled 2022-10-26: qty 1

## 2022-10-26 NOTE — Discharge Instructions (Addendum)
Take tylenol every 6 hours if needed for pain.  Warm salt water swish 4 times per day.  Take the antibiotic as prescribed.  Schedule a follow up with the dentist.  Please call and schedule a dental appointment as soon as possible. You will need to be seen within the next 14 days. Return to the emergency department for symptoms that change or worsen if you're unable to schedule an appointment.

## 2022-10-26 NOTE — ED Provider Notes (Signed)
[facility] Provider Note    Event Date/Time   First MD Initiated Contact with Patient 10/26/22 [phone]     (approximate)   History   Dental Pain   HPI  Vanessa Francis is a 32 y.o. female  with history of thrombocytopenia  and as listed in EMR presents to the emergency department for dental pain. She is [redacted] weeks pregnant. She took Tylenol around midnight and again this morning. Pain is worsening. She denies facial swelling or fever.      Physical Exam   Triage Vital Signs: ED Triage Vitals  Encounter Vitals Group     BP 10/26/22 0712 (!) 134/92     Systolic BP Percentile --      Diastolic BP Percentile --      Pulse Rate 10/26/22 0712 83     Resp 10/26/22 0712 15     Temp 10/26/22 0712 98.2 F (36.8 C)     Temp Source 10/26/22 0712 Oral     SpO2 10/26/22 0712 100 %     Weight 10/26/22 0713 193 lb (87.5 kg)     Height 10/26/22 0713 5\' 5"  (1.651 m)     Head Circumference --      Peak Flow --      Pain Score 10/26/22 0713 9     Pain Loc --      Pain Education --      Exclude from Growth Chart --     Most recent vital signs: Vitals:   10/26/22 0712  BP: (!) 134/92  Pulse: 83  Resp: 15  Temp: 98.2 F (36.8 C)  SpO2: 100%    General: Awake, no distress.  CV:  Good peripheral perfusion.  Resp:  Normal effort.  Abd:  No distention.  Other:  1st molar appears to have cavity. No obvious dental abscess.   ED Results / Procedures / Treatments   Labs (all labs ordered are listed, but only abnormal results are displayed) Labs Reviewed - No data to display   EKG     RADIOLOGY  Image and radiology report reviewed and interpreted by me. Radiology report consistent with the same.    PROCEDURES:  Critical Care performed: No  Procedures   MEDICATIONS ORDERED IN ED:  Medications  HYDROcodone-acetaminophen (NORCO/VICODIN) 5-325 MG per tablet 1 tablet (1 tablet Oral Given 10/26/22 0751)  amoxicillin (AMOXIL) capsule 500 mg (500  mg Oral Given 10/26/22 0751)     IMPRESSION / MDM / ASSESSMENT AND PLAN / ED COURSE   I have reviewed the triage note.  Differential diagnosis includes, but is not limited to, cavity, dental abscess.  Patient's presentation is most consistent with acute, uncomplicated illness.  32 year old female presents to the emergency department for treatment and evaluation of dental pain that started last night. See HPI.  On exam, it does look like she has a cavity in the 1st molar. She is sensitive to air and cold fluids.  Plan will be to treat her with antibiotics, short course of pain medication, salt water rinses, and outpatient follow up with a dentist.       FINAL CLINICAL IMPRESSION(S) / ED DIAGNOSES   Final diagnoses:  Pain due to dental caries     Rx / DC Orders   ED Discharge Orders          Ordered    amoxicillin (AMOXIL) 500 MG tablet  3 times daily        10/26/22 0754  lidocaine (XYLOCAINE) 2 % solution  As needed        10/26/22 0756             Note:  This document was prepared using Dragon voice recognition software and may include unintentional dictation errors.   Chinita Pester, FNP 10/26/22 1610    Shaune Pollack, MD 10/27/22 [phone]

## 2022-10-26 NOTE — ED Triage Notes (Signed)
Patient in via POV, reports top right dental pain, states it woke her from her sleep.  Denies any previous issues w/ that tooth.  Ambulatory to triage, NAD noted at this time.

## 2022-10-30 ENCOUNTER — Other Ambulatory Visit: Payer: Self-pay

## 2022-10-30 DIAGNOSIS — O30049 Twin pregnancy, dichorionic/diamniotic, unspecified trimester: Secondary | ICD-10-CM

## 2022-10-30 DIAGNOSIS — Z131 Encounter for screening for diabetes mellitus: Secondary | ICD-10-CM

## 2022-10-30 DIAGNOSIS — D649 Anemia, unspecified: Secondary | ICD-10-CM

## 2022-10-30 DIAGNOSIS — O0990 Supervision of high risk pregnancy, unspecified, unspecified trimester: Secondary | ICD-10-CM

## 2022-10-31 ENCOUNTER — Ambulatory Visit (INDEPENDENT_AMBULATORY_CARE_PROVIDER_SITE_OTHER): Payer: Medicaid Other | Admitting: Advanced Practice Midwife

## 2022-10-31 ENCOUNTER — Other Ambulatory Visit: Payer: Medicaid Other

## 2022-10-31 ENCOUNTER — Encounter: Payer: Self-pay | Admitting: Advanced Practice Midwife

## 2022-10-31 VITALS — BP 104/61 | HR 88 | Wt 199.5 lb

## 2022-10-31 DIAGNOSIS — O0990 Supervision of high risk pregnancy, unspecified, unspecified trimester: Secondary | ICD-10-CM

## 2022-10-31 DIAGNOSIS — O365990 Maternal care for other known or suspected poor fetal growth, unspecified trimester, not applicable or unspecified: Secondary | ICD-10-CM | POA: Insufficient documentation

## 2022-10-31 DIAGNOSIS — O30043 Twin pregnancy, dichorionic/diamniotic, third trimester: Secondary | ICD-10-CM

## 2022-10-31 DIAGNOSIS — Z3A28 28 weeks gestation of pregnancy: Secondary | ICD-10-CM

## 2022-10-31 HISTORY — DX: Maternal care for other known or suspected poor fetal growth, unspecified trimester, not applicable or unspecified: O36.5990

## 2022-10-31 LAB — POCT URINALYSIS DIPSTICK
Bilirubin, UA: NEGATIVE
Blood, UA: NEGATIVE
Glucose, UA: NEGATIVE
Ketones, UA: NEGATIVE
Leukocytes, UA: NEGATIVE
Nitrite, UA: POSITIVE
Protein, UA: POSITIVE — AB
Spec Grav, UA: 1.015 (ref 1.010–1.025)
Urobilinogen, UA: 0.2 E.U./dL
pH, UA: 7 (ref 5.0–8.0)

## 2022-10-31 NOTE — Progress Notes (Signed)
Routine Prenatal Care Visit  Subjective  Rucha Wissinger is a 32 y.o. G3P2002 at [redacted]w[redacted]d being seen today for ongoing prenatal care.  She is currently monitored for the following issues for this high-risk pregnancy and has Obesity BMI=33.7; Supervision of high risk pregnancy, antepartum; Twin gestation, dichorionic diamniotic; and Pregnancy affected by fetal growth restriction on their problem list.  ----------------------------------------------------------------------------------- Patient reports some right side pain she describes as round ligament. Reviewed results of recent MFM u/s- growth restriction both twins and recommendations for management: weekly dopplers, growth q 3 weeks, delivery around 36-37 weeks or sooner if indicated.  28 week labs today. Contractions: Not present. Vag. Bleeding: None.  Movement: Present. Leaking Fluid denies.  ----------------------------------------------------------------------------------- The following portions of the patient's history were reviewed and updated as appropriate: allergies, current medications, past family history, past medical history, past social history, past surgical history and problem list. Problem list updated.  Objective  Blood pressure 104/61, pulse 88, weight 199 lb 8 oz (90.5 kg), last menstrual period 04/22/2022. Pregravid weight 200 lb (90.7 kg) Total Weight Gain -8 oz (-0.227 kg) Urinalysis: Urine Protein    Urine Glucose    Fetal Status: Fetal Heart Rate (bpm): 130, 150   Movement: Present     General:  Alert, oriented and cooperative. Patient is in no acute distress.  Skin: Skin is warm and dry. No rash noted.   Cardiovascular: Normal heart rate noted  Respiratory: Normal respiratory effort, no problems with respiration noted  Abdomen: Soft, gravid, appropriate for gestational age. Pain/Pressure: Absent     Pelvic:  Cervical exam deferred        Extremities: Normal range of motion.  Edema: None  Mental Status: Normal mood and  affect. Normal behavior. Normal judgment and thought content.   Assessment   32 y.o. [id number] at [redacted]w[redacted]d by  01/19/2023, by Ultrasound presenting for routine prenatal visit  Plan   third Problems (from 07/10/22 to present)     Problem Noted Resolved   Supervision of other normal pregnancy, antepartum 07/10/2022 by Loran Senters, CMA 09/10/2022 by Glenetta Borg, CNM   Overview Addendum 08/14/2022  4:49 PM by Ellwood Sayers, CNM     Clinical Staff Provider  Office Location  [facility] Dating  12wk [place], mono-di twins   Language  English Anatomy US    Flu Vaccine  offer Genetic Screen  NIPS: neg  TDaP vaccine   offer Hgb A1C or  GTT Early : Third trimester :   Covid declined   LAB RESULTS   Rhogam  B/Positive/-- (04/03 1556)  Blood Type B/Positive/-- (04/03 1556)   Feeding Plan breast Antibody Negative (04/03 1556)  Contraception depo Rubella 2.75 (04/03 1556)  Circumcision yes RPR Non Reactive (04/03 1556)   Pediatrician  Phineas Real HBsAg Negative (04/03 1556)   Support Person Jill Alexanders, Mom HIV Non Reactive (04/03 1556)  Prenatal Classes no Varicella Immune    GBS  (For PCN allergy, check sensitivities)   BTL Consent  Hep C Non Reactive (04/03 1556)   VBAC Consent  Pap Diagnosis  Date Value Ref Range Status  07/17/2022   Final   - Negative for intraepithelial lesion or malignancy (NILM)      Hgb Electro      CF      SMA         Monochorionic Diamniotic Twin Pregnancy Plan  Mom should take 60-120mg  of elemental iron a day as well as 1mg  of folic acid  11-14 wks: Evaluate for  early growth discordance between  16 wks: Begin Ultrasounds for TTTS checks every 2 weeks (monitor MVP* for each twin and visualize the bladder) 18-20 wks:  Anatomy Ultrasound 22 wks: Fetal Echo 20-Delivery: Growth Ultrasounds every 2-4 weeks** 32 wks: Initiate NSTs  Delivery No complications: 34 0/7 - 37 6/7 Isolated IUGR: 32 0/7- 34 6/7  Genetic screening offered with first trimester  screen (11-14 wks) or CVS sampling (10-12 wks)  Risks: TTTS complicates up to 10-15% of monochorionic pregnancies TAPS complicates 3-5% of monochorionic pregnancies TRAP complicates 1% of monochorionic pregnancies A fetal demise is one twin results in a 40-50% risk of death of neurologic handicap in the surviving twin There is a ninefold increased risk of congenital heart defects, 4-5%. Normal risk in 0.5%. Perinatal loss and handicap rate is 3-5 times higher than a dichorionic pregnancy  *MVP for either twin > 8cm or < 2cm should result in MFM referral Weekly [place] if issues with MVP arise  **IUGR or Growth discordance > 20% should prompt MFM consultation CALCULATOR: http://perinatology.com/calculators/Twin%20Discordance.htm           Preterm labor symptoms and general obstetric precautions including but not limited to vaginal bleeding, contractions, leaking of fluid and fetal movement were reviewed in detail with the patient. Please refer to After Visit Summary for other counseling recommendations.   Return in about 2 weeks (around 11/14/2022) for rob and f/u with MFM for dopplers/growth.  Tresea Mall, CNM 10/31/2022 3:49 PM

## 2022-11-01 ENCOUNTER — Other Ambulatory Visit: Payer: Self-pay | Admitting: Maternal & Fetal Medicine

## 2022-11-01 ENCOUNTER — Other Ambulatory Visit: Payer: Self-pay | Admitting: Obstetrics

## 2022-11-01 ENCOUNTER — Other Ambulatory Visit: Payer: Self-pay

## 2022-11-01 ENCOUNTER — Encounter: Payer: Self-pay | Admitting: Obstetrics

## 2022-11-01 DIAGNOSIS — O365931 Maternal care for other known or suspected poor fetal growth, third trimester, fetus 1: Secondary | ICD-10-CM

## 2022-11-01 DIAGNOSIS — O30049 Twin pregnancy, dichorionic/diamniotic, unspecified trimester: Secondary | ICD-10-CM

## 2022-11-01 DIAGNOSIS — O365932 Maternal care for other known or suspected poor fetal growth, third trimester, fetus 2: Secondary | ICD-10-CM

## 2022-11-01 MED ORDER — FERROUS SULFATE 325 (65 FE) MG PO TABS: 325.0000 mg | ORAL_TABLET | Freq: Every day | ORAL | 3 refills | Status: DC

## 2022-11-04 ENCOUNTER — Other Ambulatory Visit: Payer: Self-pay | Admitting: Advanced Practice Midwife

## 2022-11-04 ENCOUNTER — Other Ambulatory Visit: Payer: Self-pay

## 2022-11-04 ENCOUNTER — Ambulatory Visit: Payer: Medicaid Other | Attending: Maternal & Fetal Medicine

## 2022-11-04 ENCOUNTER — Ambulatory Visit: Payer: Medicaid Other

## 2022-11-04 DIAGNOSIS — O2343 Unspecified infection of urinary tract in pregnancy, third trimester: Secondary | ICD-10-CM

## 2022-11-04 DIAGNOSIS — O30043 Twin pregnancy, dichorionic/diamniotic, third trimester: Secondary | ICD-10-CM

## 2022-11-04 DIAGNOSIS — Z3A29 29 weeks gestation of pregnancy: Secondary | ICD-10-CM | POA: Diagnosis not present

## 2022-11-04 DIAGNOSIS — O365931 Maternal care for other known or suspected poor fetal growth, third trimester, fetus 1: Secondary | ICD-10-CM | POA: Diagnosis not present

## 2022-11-04 DIAGNOSIS — O365921 Maternal care for other known or suspected poor fetal growth, second trimester, fetus 1: Secondary | ICD-10-CM | POA: Insufficient documentation

## 2022-11-04 DIAGNOSIS — E669 Obesity, unspecified: Secondary | ICD-10-CM

## 2022-11-04 DIAGNOSIS — O99213 Obesity complicating pregnancy, third trimester: Secondary | ICD-10-CM | POA: Insufficient documentation

## 2022-11-04 DIAGNOSIS — O365922 Maternal care for other known or suspected poor fetal growth, second trimester, fetus 2: Secondary | ICD-10-CM | POA: Insufficient documentation

## 2022-11-04 DIAGNOSIS — O30049 Twin pregnancy, dichorionic/diamniotic, unspecified trimester: Secondary | ICD-10-CM

## 2022-11-04 DIAGNOSIS — O365932 Maternal care for other known or suspected poor fetal growth, third trimester, fetus 2: Secondary | ICD-10-CM

## 2022-11-04 MED ORDER — CEPHALEXIN 500 MG PO CAPS
500.0000 mg | ORAL_CAPSULE | Freq: Three times a day (TID) | ORAL | 0 refills | Status: DC
Start: 2022-11-04 — End: 2022-12-13

## 2022-11-04 NOTE — Patient Instructions (Signed)
Please excuse pt from work due to a appointment at [facility] this afternoon.  Thank you, Roxy Horseman, RN [phone]

## 2022-11-04 NOTE — Progress Notes (Signed)
Rx keflex sent to treat e coli- 25-50k. Patient informed of Rx.

## 2022-11-04 NOTE — Procedures (Signed)
Vanessa Francis 1991/02/18 [redacted]w[redacted]d   Fetus B Non-Stress Test Interpretation for 11/04/22  Indication:  DiDi Twins  Fetal Heart Rate Fetus B Mode: External Baseline Rate (B): 140 BPM (Girl) Variability: Moderate Accelerations: 10 x 10 Decelerations: None  Uterine Activity Mode: Laveeda [name] 04/11/1990 [redacted]w[redacted]d  Fetus A Non-Stress Test Interpretation for 11/04/22  Indication:  DiDi Twins  Fetal Heart Rate A Mode: External Baseline Rate (A): 120 bpm Variability: Moderate Accelerations: 10 x 10 Decelerations: None Multiple birth?: Yes  Uterine Activity Mode: Toco  Interpretation (Fetal Testing) Nonstress Test Interpretation: Reactive (Dr. Bryn Gulling interpreted NST)

## 2022-11-11 ENCOUNTER — Other Ambulatory Visit: Payer: Self-pay

## 2022-11-11 ENCOUNTER — Other Ambulatory Visit: Payer: Self-pay | Admitting: Maternal & Fetal Medicine

## 2022-11-11 ENCOUNTER — Ambulatory Visit: Payer: Medicaid Other

## 2022-11-11 ENCOUNTER — Ambulatory Visit (HOSPITAL_BASED_OUTPATIENT_CLINIC_OR_DEPARTMENT_OTHER): Payer: Medicaid Other

## 2022-11-11 ENCOUNTER — Ambulatory Visit: Payer: Medicaid Other | Attending: Maternal & Fetal Medicine | Admitting: Maternal & Fetal Medicine

## 2022-11-11 DIAGNOSIS — E669 Obesity, unspecified: Secondary | ICD-10-CM

## 2022-11-11 DIAGNOSIS — O365932 Maternal care for other known or suspected poor fetal growth, third trimester, fetus 2: Secondary | ICD-10-CM

## 2022-11-11 DIAGNOSIS — O365931 Maternal care for other known or suspected poor fetal growth, third trimester, fetus 1: Secondary | ICD-10-CM | POA: Diagnosis not present

## 2022-11-11 DIAGNOSIS — Z3A30 30 weeks gestation of pregnancy: Secondary | ICD-10-CM

## 2022-11-11 DIAGNOSIS — O30043 Twin pregnancy, dichorionic/diamniotic, third trimester: Secondary | ICD-10-CM

## 2022-11-11 DIAGNOSIS — O99213 Obesity complicating pregnancy, third trimester: Secondary | ICD-10-CM | POA: Diagnosis not present

## 2022-11-11 DIAGNOSIS — Z362 Encounter for other antenatal screening follow-up: Secondary | ICD-10-CM | POA: Insufficient documentation

## 2022-11-11 NOTE — Procedures (Signed)
Olaj[place]oke Loberg 1991/03/26 [redacted]w[redacted]d  Fetus A Non-Stress Test Interpretation for 11/11/22  Indication:  DiDi Twins, IUGR, Obesity  Fetal Heart Rate A Mode: External Baseline Rate (A): 130 bpm Variability: Moderate Accelerations: 10 x 10 Decelerations: None Multiple birth?: Yes  Uterine Activity Mode: Toco  Interpretation (Fetal Testing) Nonstress Test Interpretation: Reactive (Dr. Darra Lis interpreted NST remotely)  Clide Cliff 1990-08-09 [redacted]w[redacted]d   Fetus B Non-Stress Test Interpretation for 11/11/22  Indication:  DiDi Twins, IUGR, Obesity  Fetal Heart Rate Fetus B Mode: External Baseline Rate (B): 140 BPM Variability: Moderate Accelerations: 10 x 10 Decelerations: None  Uterine Activity Mode: Toco

## 2022-11-12 ENCOUNTER — Other Ambulatory Visit: Payer: Self-pay

## 2022-11-12 DIAGNOSIS — O30043 Twin pregnancy, dichorionic/diamniotic, third trimester: Secondary | ICD-10-CM

## 2022-11-14 ENCOUNTER — Ambulatory Visit (INDEPENDENT_AMBULATORY_CARE_PROVIDER_SITE_OTHER): Payer: Medicaid Other | Admitting: Obstetrics and Gynecology

## 2022-11-14 VITALS — BP 125/74 | HR 92 | Wt 201.0 lb

## 2022-11-14 DIAGNOSIS — Z3A30 30 weeks gestation of pregnancy: Secondary | ICD-10-CM

## 2022-11-14 DIAGNOSIS — O365990 Maternal care for other known or suspected poor fetal growth, unspecified trimester, not applicable or unspecified: Secondary | ICD-10-CM

## 2022-11-14 DIAGNOSIS — O99013 Anemia complicating pregnancy, third trimester: Secondary | ICD-10-CM

## 2022-11-14 DIAGNOSIS — O2343 Unspecified infection of urinary tract in pregnancy, third trimester: Secondary | ICD-10-CM | POA: Insufficient documentation

## 2022-11-14 DIAGNOSIS — O0990 Supervision of high risk pregnancy, unspecified, unspecified trimester: Secondary | ICD-10-CM

## 2022-11-14 DIAGNOSIS — O30043 Twin pregnancy, dichorionic/diamniotic, third trimester: Secondary | ICD-10-CM

## 2022-11-14 LAB — POCT URINALYSIS DIPSTICK OB
Bilirubin, UA: NEGATIVE
Glucose, UA: NEGATIVE
Ketones, UA: NEGATIVE
Nitrite, UA: POSITIVE
Spec Grav, UA: 1.015 (ref 1.010–1.025)
Urobilinogen, UA: 0.2 E.U./dL
pH, UA: 6.5 (ref 5.0–8.0)

## 2022-11-14 MED ORDER — NITROFURANTOIN MONOHYD MACRO 100 MG PO CAPS: 100.0000 mg | ORAL_CAPSULE | Freq: Two times a day (BID) | ORAL | 0 refills | Status: DC

## 2022-11-14 NOTE — Progress Notes (Signed)
ROB [redacted]w[redacted]d: She is doing well. She never got her Keflex and started it because of the cost of the medication. She denies having any symptoms. She reports good fetal movement. She has no new concerns today.

## 2022-11-14 NOTE — Progress Notes (Signed)
ROB: Patient is a 32 y.o. G3P2002 at [redacted]w[redacted]d who presents for routine OB care.  Pregnancy is complicated by has Obesity BMI=33.7; Supervision of high risk pregnancy, antepartum; Twin gestation, dichorionic diamniotic; and Pregnancy affected by fetal growth restriction on their problem list. Patient notes feeling pressure in the upper portion of her stomach. Reviewed chart, patient with UTI, remains untreated as she notes she could not afford the antibiotic. Discussed importance of treatment of UTIs in pregnancy to prevent kidney infections (pyelonephritis) requiring hospitalization, preterm labor. Patient's partner notes that he will get the medication for her.  Also offered to prescribe a cheaper medication. 28 week labs normal except mild anemia, initiated on oral iron. Has Dopplers weekly for twin pregnancy with MFM, also due for f/u growth scan next week. Currently both twins with IUGR (6% and 7% respectively). RTC in 2 weeks.

## 2022-11-18 ENCOUNTER — Other Ambulatory Visit: Payer: Self-pay

## 2022-11-18 ENCOUNTER — Ambulatory Visit (HOSPITAL_BASED_OUTPATIENT_CLINIC_OR_DEPARTMENT_OTHER): Payer: Medicaid Other | Admitting: Obstetrics and Gynecology

## 2022-11-18 ENCOUNTER — Ambulatory Visit: Payer: Medicaid Other | Attending: Obstetrics and Gynecology

## 2022-11-18 ENCOUNTER — Other Ambulatory Visit: Payer: Self-pay | Admitting: Obstetrics and Gynecology

## 2022-11-18 ENCOUNTER — Other Ambulatory Visit: Payer: Medicaid Other

## 2022-11-18 DIAGNOSIS — O99213 Obesity complicating pregnancy, third trimester: Secondary | ICD-10-CM | POA: Diagnosis not present

## 2022-11-18 DIAGNOSIS — O0990 Supervision of high risk pregnancy, unspecified, unspecified trimester: Secondary | ICD-10-CM

## 2022-11-18 DIAGNOSIS — Z3A31 31 weeks gestation of pregnancy: Secondary | ICD-10-CM | POA: Diagnosis not present

## 2022-11-18 DIAGNOSIS — O365931 Maternal care for other known or suspected poor fetal growth, third trimester, fetus 1: Secondary | ICD-10-CM

## 2022-11-18 DIAGNOSIS — O30043 Twin pregnancy, dichorionic/diamniotic, third trimester: Secondary | ICD-10-CM

## 2022-11-18 DIAGNOSIS — O99013 Anemia complicating pregnancy, third trimester: Secondary | ICD-10-CM

## 2022-11-18 DIAGNOSIS — E669 Obesity, unspecified: Secondary | ICD-10-CM

## 2022-11-18 DIAGNOSIS — E66811 Obesity, class 1: Secondary | ICD-10-CM

## 2022-11-18 DIAGNOSIS — O365932 Maternal care for other known or suspected poor fetal growth, third trimester, fetus 2: Secondary | ICD-10-CM | POA: Diagnosis not present

## 2022-11-18 DIAGNOSIS — O365990 Maternal care for other known or suspected poor fetal growth, unspecified trimester, not applicable or unspecified: Secondary | ICD-10-CM

## 2022-11-18 DIAGNOSIS — O2343 Unspecified infection of urinary tract in pregnancy, third trimester: Secondary | ICD-10-CM

## 2022-11-18 NOTE — Procedures (Signed)
Vanessa Francis Oct 19, 1990 [redacted]w[redacted]d  Fetus A Non-Stress Test Interpretation for 11/18/22  Indication: IUGR, Twins Vanessa Francis [birth year] [redacted]w[redacted]d   Fetus B Non-Stress Test Interpretation for 11/18/22  Indication: IUGR, Twins  Fetal Heart Rate Fetus B Mode: External Baseline Rate (B): 130 BPM Variability: Moderate Accelerations: 15 x 15 Decelerations:  (unable to determine d/t noncontinuous tracing at 1605)  Uterine Activity Mode: Toco Contraction Frequency (min): 10 Contraction Duration (sec): 80-110 Contraction Quality: Mild Resting Tone Palpated: Relaxed Resting Time: Adequate  Interpretation (Baby B - Fetal Testing) Nonstress Test Interpretation (Baby B): Reactive Overall Impression (Baby B): Reassuring for gestational age    Fetal Heart Rate A Mode: External Baseline Rate (A): 130 bpm Variability: Moderate Accelerations: 15 x 15 Decelerations: Variable Multiple birth?: Yes  Uterine Activity Mode: Toco Contraction Frequency (min): 10 Contraction Duration (sec): 80-110 Contraction Quality: Mild Resting Tone Palpated: Relaxed Resting Time: Adequate  Interpretation (Fetal Testing) Nonstress Test Interpretation: Reactive Overall Impression: Reassuring for gestational age

## 2022-11-20 ENCOUNTER — Other Ambulatory Visit: Payer: Self-pay | Admitting: *Deleted

## 2022-11-20 DIAGNOSIS — O30003 Twin pregnancy, unspecified number of placenta and unspecified number of amniotic sacs, third trimester: Secondary | ICD-10-CM

## 2022-11-20 DIAGNOSIS — O365990 Maternal care for other known or suspected poor fetal growth, unspecified trimester, not applicable or unspecified: Secondary | ICD-10-CM

## 2022-11-21 ENCOUNTER — Other Ambulatory Visit: Payer: Medicaid Other

## 2022-11-21 ENCOUNTER — Other Ambulatory Visit: Payer: Self-pay | Admitting: *Deleted

## 2022-11-21 DIAGNOSIS — O365990 Maternal care for other known or suspected poor fetal growth, unspecified trimester, not applicable or unspecified: Secondary | ICD-10-CM

## 2022-11-25 ENCOUNTER — Other Ambulatory Visit: Payer: Self-pay

## 2022-11-25 ENCOUNTER — Ambulatory Visit: Payer: Medicaid Other

## 2022-11-25 ENCOUNTER — Other Ambulatory Visit: Payer: Self-pay | Admitting: Obstetrics

## 2022-11-25 ENCOUNTER — Ambulatory Visit: Payer: Medicaid Other | Attending: Obstetrics

## 2022-11-25 VITALS — BP 120/72 | HR 95 | Temp 98.0°F | Ht 65.0 in | Wt 201.5 lb

## 2022-11-25 DIAGNOSIS — O99213 Obesity complicating pregnancy, third trimester: Secondary | ICD-10-CM | POA: Insufficient documentation

## 2022-11-25 DIAGNOSIS — Z3A32 32 weeks gestation of pregnancy: Secondary | ICD-10-CM | POA: Diagnosis not present

## 2022-11-25 DIAGNOSIS — O365990 Maternal care for other known or suspected poor fetal growth, unspecified trimester, not applicable or unspecified: Secondary | ICD-10-CM

## 2022-11-25 DIAGNOSIS — O30043 Twin pregnancy, dichorionic/diamniotic, third trimester: Secondary | ICD-10-CM

## 2022-11-25 DIAGNOSIS — O2343 Unspecified infection of urinary tract in pregnancy, third trimester: Secondary | ICD-10-CM | POA: Diagnosis not present

## 2022-11-25 DIAGNOSIS — E669 Obesity, unspecified: Secondary | ICD-10-CM

## 2022-11-25 DIAGNOSIS — O365932 Maternal care for other known or suspected poor fetal growth, third trimester, fetus 2: Secondary | ICD-10-CM

## 2022-11-25 DIAGNOSIS — O365931 Maternal care for other known or suspected poor fetal growth, third trimester, fetus 1: Secondary | ICD-10-CM

## 2022-11-25 NOTE — Procedures (Signed)
Vanessa Francis Feb 15, 1991 [redacted]w[redacted]d  Fetus A Non-Stress Test Interpretation for 11/25/22  Indication:  DiDi Twins ,IUGR  Fetal Heart Rate A Mode: External Baseline Rate (A): 130 bpm Variability: Moderate Accelerations: 15 x 15 Multiple birth?: Yes  Uterine Activity Mode: Toco  Interpretation (Fetal Testing) Nonstress Test Interpretation: Reactive (Per Dr. Parke Poisson)  Vanessa Francis 07-Apr-1991 [redacted]w[redacted]d   Fetus B Non-Stress Test Interpretation for 11/25/22  Indication:  DiDi Twins , IUGR  Fetal Heart Rate Fetus B Mode: External Baseline Rate (B): 150 BPM Variability: Moderate Accelerations: 15 x 15 Decelerations: None  Uterine Activity Mode: Toco  Interpretation (Baby B - Fetal Testing) Nonstress Test Interpretation (Baby B): Reactive (Per Dr. Parke Poisson) Overall Impression (Baby B): Reassuring for gestational age

## 2022-11-26 ENCOUNTER — Other Ambulatory Visit: Payer: Self-pay | Admitting: *Deleted

## 2022-11-26 DIAGNOSIS — O365990 Maternal care for other known or suspected poor fetal growth, unspecified trimester, not applicable or unspecified: Secondary | ICD-10-CM

## 2022-11-27 ENCOUNTER — Other Ambulatory Visit: Payer: Self-pay

## 2022-11-27 ENCOUNTER — Other Ambulatory Visit: Payer: Self-pay | Admitting: *Deleted

## 2022-11-27 DIAGNOSIS — O365990 Maternal care for other known or suspected poor fetal growth, unspecified trimester, not applicable or unspecified: Secondary | ICD-10-CM

## 2022-11-27 DIAGNOSIS — O365931 Maternal care for other known or suspected poor fetal growth, third trimester, fetus 1: Secondary | ICD-10-CM

## 2022-11-27 DIAGNOSIS — O365932 Maternal care for other known or suspected poor fetal growth, third trimester, fetus 2: Secondary | ICD-10-CM

## 2022-11-27 DIAGNOSIS — O30043 Twin pregnancy, dichorionic/diamniotic, third trimester: Secondary | ICD-10-CM

## 2022-11-28 ENCOUNTER — Ambulatory Visit (INDEPENDENT_AMBULATORY_CARE_PROVIDER_SITE_OTHER): Payer: Medicaid Other

## 2022-11-28 VITALS — BP 120/82 | HR 88 | Wt 201.0 lb

## 2022-11-28 DIAGNOSIS — O0993 Supervision of high risk pregnancy, unspecified, third trimester: Secondary | ICD-10-CM

## 2022-11-28 DIAGNOSIS — Z3A32 32 weeks gestation of pregnancy: Secondary | ICD-10-CM

## 2022-11-28 DIAGNOSIS — O0990 Supervision of high risk pregnancy, unspecified, unspecified trimester: Secondary | ICD-10-CM

## 2022-11-28 DIAGNOSIS — O99013 Anemia complicating pregnancy, third trimester: Secondary | ICD-10-CM

## 2022-11-28 NOTE — Assessment & Plan Note (Signed)
Has weekly testing scheduled with MFM. BPP on 8/19, 10/10 for both twins.  Reviewed kick counts and preterm labor warning signs. Instructed to call office or come to hospital with persistent headache, vision changes, regular contractions, leaking of fluid, decreased fetal movement or vaginal bleeding.

## 2022-11-28 NOTE — Progress Notes (Signed)
    Return Prenatal Note   Assessment/Plan   Plan  32 y.o. G3P2002 at [redacted]w[redacted]d presents for follow-up OB visit. Reviewed prenatal record including previous visit note.  Supervision of high risk pregnancy, antepartum Has weekly testing scheduled with MFM. BPP on 8/19, 10/10 for both twins.  Reviewed kick counts and preterm labor warning signs. Instructed to call office or come to hospital with persistent headache, vision changes, regular contractions, leaking of fluid, decreased fetal movement or vaginal bleeding.   No orders of the defined types were placed in this encounter.  Return for Already scheduled appointment.   Future Appointments  Date Time Provider Department Center  12/02/2022  2:00 PM [facility]  12/02/2022  3:00 PM [facility] NST [facility] None  12/12/2022 10:55 AM Allie Bossier, MD [facility] None  12/13/2022 12:30 PM [facility] NURSE [facility]  12/13/2022 12:45 PM [facility]  12/13/2022  2:15 PM [facility] NST [facility]  12/18/2022  2:00 PM [facility] US[facility]  12/18/2022  3:00 PM [facility] NST [facility] None  12/26/2022 11:15 AM Doreene Burke, CNM [facility] None    For next visit:  continue with routine prenatal care     Subjective   32 y.o. [id number] at [redacted]w[redacted]d presents for this follow-up prenatal visit.  Patient is having some sleep issues but overall doing well.  Patient reports: Movement: Present Contractions: Irritability  Objective   Flow sheet Vitals: Pulse Rate: 88 BP: 120/82 Fundal Height: 36 cm Fetal Heart Rate (bpm): A: 135  B: 140 Total weight gain: 1 lb (0.454 kg)  General Appearance  No acute distress, well appearing, and well nourished Pulmonary   Normal work of breathing Neurologic   Alert and oriented to person, place, and time Psychiatric   Mood and affect within normal limits  Lindalou Hose Anique Beckley, CNM  11/27/2409:43 AM

## 2022-12-02 ENCOUNTER — Other Ambulatory Visit: Payer: Self-pay | Admitting: Obstetrics and Gynecology

## 2022-12-02 ENCOUNTER — Other Ambulatory Visit: Payer: Self-pay

## 2022-12-02 ENCOUNTER — Ambulatory Visit: Payer: Medicaid Other | Attending: Obstetrics and Gynecology

## 2022-12-02 ENCOUNTER — Ambulatory Visit: Payer: Medicaid Other

## 2022-12-02 DIAGNOSIS — O30003 Twin pregnancy, unspecified number of placenta and unspecified number of amniotic sacs, third trimester: Secondary | ICD-10-CM

## 2022-12-02 DIAGNOSIS — O365932 Maternal care for other known or suspected poor fetal growth, third trimester, fetus 2: Secondary | ICD-10-CM | POA: Insufficient documentation

## 2022-12-02 DIAGNOSIS — O99213 Obesity complicating pregnancy, third trimester: Secondary | ICD-10-CM | POA: Insufficient documentation

## 2022-12-02 DIAGNOSIS — O365990 Maternal care for other known or suspected poor fetal growth, unspecified trimester, not applicable or unspecified: Secondary | ICD-10-CM

## 2022-12-02 DIAGNOSIS — O30043 Twin pregnancy, dichorionic/diamniotic, third trimester: Secondary | ICD-10-CM | POA: Insufficient documentation

## 2022-12-02 DIAGNOSIS — E669 Obesity, unspecified: Secondary | ICD-10-CM | POA: Diagnosis not present

## 2022-12-02 DIAGNOSIS — O365931 Maternal care for other known or suspected poor fetal growth, third trimester, fetus 1: Secondary | ICD-10-CM | POA: Insufficient documentation

## 2022-12-02 DIAGNOSIS — Z3A33 33 weeks gestation of pregnancy: Secondary | ICD-10-CM | POA: Diagnosis not present

## 2022-12-12 ENCOUNTER — Ambulatory Visit (INDEPENDENT_AMBULATORY_CARE_PROVIDER_SITE_OTHER): Payer: Medicaid Other | Admitting: Obstetrics & Gynecology

## 2022-12-12 ENCOUNTER — Other Ambulatory Visit (HOSPITAL_COMMUNITY)
Admission: RE | Admit: 2022-12-12 | Discharge: 2022-12-12 | Disposition: A | Discharge status: Home / Self Care | Payer: Medicaid Other | Source: Ambulatory Visit | Attending: Obstetrics & Gynecology | Admitting: Obstetrics & Gynecology

## 2022-12-12 VITALS — BP 105/73 | HR 76 | Wt 206.0 lb

## 2022-12-12 DIAGNOSIS — E669 Obesity, unspecified: Secondary | ICD-10-CM

## 2022-12-12 DIAGNOSIS — Z6834 Body mass index (BMI) 34.0-34.9, adult: Secondary | ICD-10-CM

## 2022-12-12 DIAGNOSIS — O99119 Other diseases of the blood and blood-forming organs and certain disorders involving the immune mechanism complicating pregnancy, unspecified trimester: Secondary | ICD-10-CM | POA: Insufficient documentation

## 2022-12-12 DIAGNOSIS — Z3A34 34 weeks gestation of pregnancy: Secondary | ICD-10-CM | POA: Insufficient documentation

## 2022-12-12 DIAGNOSIS — O0990 Supervision of high risk pregnancy, unspecified, unspecified trimester: Secondary | ICD-10-CM

## 2022-12-12 DIAGNOSIS — O30043 Twin pregnancy, dichorionic/diamniotic, third trimester: Secondary | ICD-10-CM

## 2022-12-12 DIAGNOSIS — O365990 Maternal care for other known or suspected poor fetal growth, unspecified trimester, not applicable or unspecified: Secondary | ICD-10-CM

## 2022-12-12 DIAGNOSIS — O99013 Anemia complicating pregnancy, third trimester: Secondary | ICD-10-CM

## 2022-12-12 DIAGNOSIS — D696 Thrombocytopenia, unspecified: Secondary | ICD-10-CM

## 2022-12-12 NOTE — Progress Notes (Signed)
   PRENATAL VISIT NOTE  Subjective:  Vanessa Francis is a 32 y.o. G3P2002 at [redacted]w[redacted]d being seen today for ongoing prenatal care.  She is currently monitored for the following issues for this high-risk pregnancy and has Obesity BMI=33.7; Supervision of high risk pregnancy, antepartum; Twin gestation, dichorionic diamniotic; Pregnancy affected by fetal growth restriction; Anemia of pregnancy in third trimester; UTI in pregnancy, antepartum, third trimester; and Thrombocytopenia affecting pregnancy (HCC) on their problem list.  Patient reports no complaints.  Contractions: Irritability. Vag. Bleeding: None.  Movement: Present. Denies leaking of fluid.   The following portions of the patient's history were reviewed and updated as appropriate: allergies, current medications, past family history, past medical history, past social history, past surgical history and problem list.   Objective:   Vitals:   12/12/22 1117  BP: 105/73  Pulse: 76  Weight: 206 lb (93.4 kg)    Fetal Status:     Movement: Present     General:  Alert, oriented and cooperative. Patient is in no acute distress.  Skin: Skin is warm and dry. No rash noted.   Cardiovascular: Normal heart rate noted  Respiratory: Normal respiratory effort, no problems with respiration noted  Abdomen: Soft, gravid, appropriate for gestational age.  Pain/Pressure: Present     Pelvic: Cervical exam performed in the presence of a chaperone       2/50%/-3/vertex  Extremities: Normal range of motion.     Mental Status: Normal mood and affect. Normal behavior. Normal judgment and thought content.   FHR baby A 140s FHR baby B 120s FH- 40 cm  Assessment and Plan:  Pregnancy: G3P2002 at [redacted]w[redacted]d 1. Anemia of pregnancy in third trimester  - CBC  2. Thrombocytopenia affecting pregnancy (HCC)  - CBC  3. Class 1 obesity with body mass index (BMI) of 34.0 to 34.9 in adult, unspecified obesity type, unspecified whether serious comorbidity  present   4. Supervision of high risk pregnancy, antepartum   5. Dichorionic diamniotic twin pregnancy in third trimester   6. Pregnancy affected by fetal growth restriction  - Culture, beta strep (group b only) - Cervicovaginal ancillary only - Per MFM note, rec IOL at 36 weeks - weekly testing with MFM - growth scan tomorrow with MFM  7. [redacted] weeks gestation of pregnancy  - Culture, beta strep (group b only) - Cervicovaginal ancillary only  Preterm labor symptoms and general obstetric precautions including but not limited to vaginal bleeding, contractions, leaking of fluid and fetal movement were reviewed in detail with the patient. Please refer to After Visit Summary for other counseling recommendations.   No follow-ups on file.  Future Appointments  Date Time Provider Department Center  12/13/2022 12:30 PM [facility] NURSE [facility]  12/13/2022 12:45 PM [facility] US5 [facility]  12/13/2022  2:15 PM [facility] NST [facility]  12/18/2022  2:00 PM [facility] US1 [facility]  12/18/2022  3:00 PM [facility] NST [facility] None  12/26/2022 11:15 AM Doreene Burke, CNM [facility] None    Allie Bossier, MD

## 2022-12-13 ENCOUNTER — Ambulatory Visit (INDEPENDENT_AMBULATORY_CARE_PROVIDER_SITE_OTHER): Payer: Medicaid Other

## 2022-12-13 ENCOUNTER — Telehealth: Payer: Self-pay

## 2022-12-13 ENCOUNTER — Ambulatory Visit: Payer: Medicaid Other | Attending: Obstetrics and Gynecology

## 2022-12-13 ENCOUNTER — Ambulatory Visit: Payer: Medicaid Other | Admitting: *Deleted

## 2022-12-13 ENCOUNTER — Encounter: Payer: Self-pay | Admitting: Obstetrics & Gynecology

## 2022-12-13 ENCOUNTER — Ambulatory Visit: Payer: Medicaid Other

## 2022-12-13 VITALS — BP 122/70 | HR 80

## 2022-12-13 VITALS — BP 124/79 | HR 86 | Ht 66.0 in | Wt 206.0 lb

## 2022-12-13 DIAGNOSIS — O365932 Maternal care for other known or suspected poor fetal growth, third trimester, fetus 2: Secondary | ICD-10-CM

## 2022-12-13 DIAGNOSIS — E669 Obesity, unspecified: Secondary | ICD-10-CM

## 2022-12-13 DIAGNOSIS — O30043 Twin pregnancy, dichorionic/diamniotic, third trimester: Secondary | ICD-10-CM

## 2022-12-13 DIAGNOSIS — O30003 Twin pregnancy, unspecified number of placenta and unspecified number of amniotic sacs, third trimester: Secondary | ICD-10-CM | POA: Diagnosis present

## 2022-12-13 DIAGNOSIS — O365990 Maternal care for other known or suspected poor fetal growth, unspecified trimester, not applicable or unspecified: Secondary | ICD-10-CM | POA: Diagnosis present

## 2022-12-13 DIAGNOSIS — A549 Gonococcal infection, unspecified: Secondary | ICD-10-CM

## 2022-12-13 DIAGNOSIS — Z3A34 34 weeks gestation of pregnancy: Secondary | ICD-10-CM

## 2022-12-13 DIAGNOSIS — O365931 Maternal care for other known or suspected poor fetal growth, third trimester, fetus 1: Secondary | ICD-10-CM | POA: Diagnosis not present

## 2022-12-13 DIAGNOSIS — O99213 Obesity complicating pregnancy, third trimester: Secondary | ICD-10-CM

## 2022-12-13 LAB — CERVICOVAGINAL ANCILLARY ONLY
Chlamydia: NEGATIVE
Comment: NEGATIVE
Comment: NORMAL
Neisseria Gonorrhea: POSITIVE — AB

## 2022-12-13 MED ORDER — CEFTRIAXONE SODIUM 500 MG IJ SOLR
500.0000 mg | Freq: Once | INTRAMUSCULAR | Status: AC
Start: 2022-12-13 — End: 2022-12-13
  Administered 2022-12-13: 500 mg via INTRAMUSCULAR

## 2022-12-13 NOTE — Telephone Encounter (Signed)
Called pt to discuss results no answer left voice message for her to call back. Pt needs to come in for a Rocephin shot. If she calls back please advise pt come in today,

## 2022-12-13 NOTE — Progress Notes (Unsigned)
    NURSE VISIT NOTE  Subjective:    Patient ID: Clide Cliff, female    DOB: Jan 12, 1991, 32 y.o.   MRN: [id number]  HPI  Patient is a 32 y.o. G59P2002 female who presents for Rocephin injection. Order to administer given by Nicholaus Bloom, MD.  Objective:    LMP 04/22/2022 (Approximate)   Rocephin 500mg mg given IM by: Santiago Bumpers, CMA. Site: Left Upper Outer Quandrant.   Lab Review  @THIS  VISIT ONLY@  Assessment:   1. Gonorrhea      Plan:   Return  to clinic 4 weeks for TOC.   Santiago Bumpers, CMA [facility]

## 2022-12-16 ENCOUNTER — Other Ambulatory Visit: Payer: Self-pay

## 2022-12-16 ENCOUNTER — Ambulatory Visit: Payer: Medicaid Other

## 2022-12-16 LAB — CULTURE, BETA STREP (GROUP B ONLY): Strep Gp B Culture: NEGATIVE

## 2022-12-18 ENCOUNTER — Observation Stay
Admission: RE | Admit: 2022-12-18 | Discharge: 2022-12-18 | Disposition: A | Discharge status: Home / Self Care | Payer: Medicaid Other | Attending: Obstetrics and Gynecology | Admitting: Obstetrics and Gynecology

## 2022-12-18 ENCOUNTER — Other Ambulatory Visit: Payer: Self-pay | Admitting: Obstetrics and Gynecology

## 2022-12-18 ENCOUNTER — Other Ambulatory Visit: Payer: Self-pay

## 2022-12-18 ENCOUNTER — Other Ambulatory Visit: Payer: Medicaid Other

## 2022-12-18 ENCOUNTER — Ambulatory Visit: Payer: Medicaid Other

## 2022-12-18 ENCOUNTER — Ambulatory Visit (HOSPITAL_BASED_OUTPATIENT_CLINIC_OR_DEPARTMENT_OTHER): Payer: Medicaid Other

## 2022-12-18 ENCOUNTER — Inpatient Hospital Stay: Payer: Medicaid Other | Admitting: Obstetrics

## 2022-12-18 DIAGNOSIS — Z3A35 35 weeks gestation of pregnancy: Secondary | ICD-10-CM

## 2022-12-18 DIAGNOSIS — O365931 Maternal care for other known or suspected poor fetal growth, third trimester, fetus 1: Secondary | ICD-10-CM | POA: Insufficient documentation

## 2022-12-18 DIAGNOSIS — N898 Other specified noninflammatory disorders of vagina: Secondary | ICD-10-CM

## 2022-12-18 DIAGNOSIS — O30003 Twin pregnancy, unspecified number of placenta and unspecified number of amniotic sacs, third trimester: Principal | ICD-10-CM | POA: Diagnosis present

## 2022-12-18 DIAGNOSIS — O365930 Maternal care for other known or suspected poor fetal growth, third trimester, not applicable or unspecified: Secondary | ICD-10-CM | POA: Diagnosis not present

## 2022-12-18 DIAGNOSIS — O365990 Maternal care for other known or suspected poor fetal growth, unspecified trimester, not applicable or unspecified: Secondary | ICD-10-CM

## 2022-12-18 DIAGNOSIS — Z3A33 33 weeks gestation of pregnancy: Secondary | ICD-10-CM

## 2022-12-18 DIAGNOSIS — O99213 Obesity complicating pregnancy, third trimester: Secondary | ICD-10-CM | POA: Insufficient documentation

## 2022-12-18 DIAGNOSIS — O30043 Twin pregnancy, dichorionic/diamniotic, third trimester: Secondary | ICD-10-CM | POA: Insufficient documentation

## 2022-12-18 DIAGNOSIS — E669 Obesity, unspecified: Secondary | ICD-10-CM | POA: Diagnosis not present

## 2022-12-18 DIAGNOSIS — O365932 Maternal care for other known or suspected poor fetal growth, third trimester, fetus 2: Secondary | ICD-10-CM | POA: Insufficient documentation

## 2022-12-18 DIAGNOSIS — O23593 Infection of other part of genital tract in pregnancy, third trimester: Secondary | ICD-10-CM | POA: Insufficient documentation

## 2022-12-18 DIAGNOSIS — O99891 Other specified diseases and conditions complicating pregnancy: Secondary | ICD-10-CM

## 2022-12-18 DIAGNOSIS — Z7982 Long term (current) use of aspirin: Secondary | ICD-10-CM | POA: Insufficient documentation

## 2022-12-18 LAB — RUPTURE OF MEMBRANE (ROM)PLUS: Rom Plus: NEGATIVE

## 2022-12-18 NOTE — Discharge Summary (Cosign Needed)
   Please see Final progress note for Discharge Summary .  Mirna Mires, CNM  12/18/2022 4:54 PM

## 2022-12-18 NOTE — OB Triage Note (Signed)

## 2022-12-18 NOTE — Progress Notes (Unsigned)
Pt had MFM appt today.  Ultrasound completed and Dr. Parke Poisson requested that pt be transported to [facility] for monitoring and ROM check.  Pt and pt's mom transported via wheelchair by myself and auxiliary staff to room OBS#2.  Report given to [place] on the phone prior to transport.  Discussed POC with Paula Compton, CNM as well at transport.

## 2022-12-18 NOTE — Progress Notes (Signed)
MFM Note  Vanessa Francis was seen due to IUGR of both fetuses in a dichorionic, diamniotic twin gestation.  She reports that she has been leaking fluid over the past few days.  A BPP performed today was 8 out of 8 for both twin A and twin B.    A greater than 2 cm pocket of amniotic fluid was noted around both twin A and twin B.  Both fetuses are in the vertex/vertex presentations.  Doppler studies of the umbilical arteries performed today for both twin A and twin B continues to show normal forward flow in both fetuses.  There were no signs of absent or reversed end-diastolic flow noted in either fetus.  Due to her reports of leakage of fluid, she was sent to labor and delivery for a ruptured membrane check following today's ultrasound exam.    Should it be determined that she ruptured membranes, delivery is recommended today.    However should she rule out for rupture of membranes, she should continue with the plan for an induction of labor scheduled already scheduled on Sunday, December 22, 2022.    No further exams were scheduled in our office.  A total of 20 minutes was spent counseling and coordinating the care for this patient.  Greater than 50% of the time was spent in direct face-to-face contact.

## 2022-12-18 NOTE — Final Progress Note (Signed)
Final Progress Note                                                         Discharge Summary Patient ID: Vanessa Francis MRN: [id number] DOB/AGE: 07/13/1990 32 y.o.  Admit date: 12/18/2022 Admitting provider: Mirna Mires, CNM Discharge date: 12/18/2022   Admission Diagnoses: vaginal discharge in pregnancy in third trimester Twin Gestation Iup [redacted] weeks gestation  Discharge Diagnoses:  Principal Problem:   Twin gestation in third trimester Active Problems:   Vaginal discharge in pregnancy in third trimester  Intact bag of waters Reactive fetal heart tones twin gestation  History of Present Illness: The patient is a 32 y.o. female G3P2002 at [redacted]w[redacted]d who presents for monitoring and evaluation of vaginal discharge. This is a di/di twin IUP who was being seen by MFM this afternoon, and reported leaking of fluid over the past few days. Sent by MFM to Labor & Delivery to evaluate for PROM. She reports active movement of both babies, occasional mild BH contractions. Denies vaginal bleeding or recent intercourse. She was not wearing a pad on arrival to unit.    Past Medical History:  Diagnosis Date   H/O LEEP 2018 08/03/2020   History of abnormal cervical Pap smear 05/28/2016   05/16/16 ASCUS HPV +  08/2016 [facility]  07/28/19 neg     Supervision of other normal pregnancy, antepartum 07/10/2022          Clinical Staff  Provider  Office Location   [facility]  Dating   12wk [place], mono-di twins   Language   English  Anatomy US      Flu Vaccine   offer  Genetic Screen   NIPS: neg  TDaP vaccine    offer  Hgb A1C or   GTT  Early :  Third trimester :   Covid  declined     LAB RESULTS   Rhogam   B/Positive/-- (04/03 1556)   Blood Type  B/Positive/-- (04/03 1556)   Feeding Plan  breast  Antibody   Thrombocytopenia (HCC)    Thrombocytopenia affecting pregnancy (HCC) 01/23/2015   Vaginal Pap smear, abnormal    2018 or 2019    Past Surgical History:  Procedure Laterality Date   cervical cancer  surgery  2018   [facility]    No current facility-administered medications on file prior to encounter.   Current Outpatient Medications on File Prior to Encounter  Medication Sig Dispense Refill   aspirin EC 81 MG tablet Take 162 mg by mouth daily. Swallow whole.     ferrous sulfate 325 (65 FE) MG tablet Take 1 tablet (325 mg total) by mouth daily with breakfast. 90 tablet 3   prenatal vitamin w/FE, FA (PRENATAL 1 + 1) 27-1 MG TABS tablet Take 1 tablet by mouth daily at 12 noon.      Allergies  Allergen Reactions   Soap Rash    Gain Laundry Detergent    Social History   Socioeconomic History   Marital status: Single    Spouse name: Jill Alexanders   Number of children: 2   Years of education: 14   Highest education level: Not on file  Occupational History   Occupation: pre-k Runner, broadcasting/film/video  Tobacco Use   Smoking status: Never   Smokeless tobacco: Never  Vaping Use  Vaping status: Never Used  Substance and Sexual Activity   Alcohol use: Never   Drug use: Never   Sexual activity: Yes    Partners: Male    Birth control/protection: None  Other Topics Concern   Not on file  Social History Narrative   Not on file   Social Determinants of Health   Financial Resource Strain: Medium Risk (07/10/2022)   Overall Financial Resource Strain (CARDIA)    Difficulty of Paying Living Expenses: Somewhat hard  Food Insecurity: Food Insecurity Present (07/10/2022)   Hunger Vital Sign    Worried About Running Out of Food in the Last Year: Sometimes true    Ran Out of Food in the Last Year: Sometimes true  Transportation Needs: No Transportation Needs (07/10/2022)   PRAPARE - Administrator, Civil Service (Medical): No    Lack of Transportation (Non-Medical): No  Physical Activity: Insufficiently Active (07/10/2022)   Exercise Vital Sign    Days of Exercise per Week: 2 days    Minutes of Exercise per Session: 10 min  Stress: No Stress Concern Present (07/10/2022)   [employer] of  Occupational Health - Occupational Stress Questionnaire    Feeling of Stress : Not at all  Social Connections: Moderately Integrated (07/10/2022)   Social Connection and Isolation Panel [NHANES]    Frequency of Communication with Friends and Family: More than three times a week    Frequency of Social Gatherings with Friends and Family: Twice a week    Attends Religious Services: More than 4 times per year    Active Member of [employer] or Organizations: No    Attends Banker Meetings: Never    Marital Status: Living with partner  Intimate Partner Violence: Not At Risk (07/10/2022)   Humiliation, Afraid, Rape, and Kick questionnaire    Fear of Current or Ex-Partner: No    Emotionally Abused: No    Physically Abused: No    Sexually Abused: No    Family History  Problem Relation Age of Onset   Diabetes Mother    Hypertension Mother    Heart attack Mother    Cancer Father 68       liver   Anxiety disorder Sister    Diabetes Brother    Heart disease Maternal Grandmother    Hypertension Maternal Grandmother    Healthy Maternal Grandfather    Stroke Paternal Grandmother    Healthy Daughter    Autism Son      Review of Systems  Constitutional: Negative.   Gastrointestinal: Negative.   Genitourinary: Negative.   Skin: Negative.   Psychiatric/Behavioral: Negative.    All other systems reviewed and are negative.    Physical Exam: LMP 04/22/2022 (Approximate)   Physical Exam Constitutional:      Appearance: Normal appearance.  Genitourinary:     Vulva and rectum normal.     Genitourinary Comments: Mild labial edema noted. No vaginal discharge noted externally. Spec exam revealed negative pooling of fluid.  ROM + negative.   Cardiovascular:     Rate and Rhythm: Normal rate and regular rhythm.     Pulses: Normal pulses.     Heart sounds: Normal heart sounds.  Pulmonary:     Effort: Pulmonary effort is normal.     Breath sounds: Normal breath sounds.  Abdominal:      Comments: Gravid abdomen, twin gestation, soft to palpation. Some mild BH contractions; pt unaware.   Musculoskeletal:        General: Normal  range of motion.     Cervical back: Normal range of motion.  Neurological:     Mental Status: She is alert.  Skin:    General: Skin is warm and dry.  Psychiatric:        Mood and Affect: Mood normal.        Behavior: Behavior normal.     Consults: None  Significant Findings/ Diagnostic Studies: labs:  Results for orders placed or performed during the hospital encounter of 12/18/22 (from the past 24 hour(s))  Rupture of Membrane (ROM) Plus     Status: None   Collection Time: 12/18/22  3:46 PM  Result Value Ref Range   Rom Plus NEGATIVE      Procedures: EFM NST Baseline FHR: Twin A 120 baseline, Twin B 135 baseline beats/min Variability: moderate Accelerations: present Decelerations: absent Tocometry: irregular contractions noted; palpated extremely mild by SNM  Interpretation:  INDICATIONS: patient reassurance RESULTS:  A NST procedure was performed with FHR monitoring and a normal baseline established, appropriate time of 20-40 minutes of evaluation, and accels >2 seen w 15x15 characteristics.  Results show a REACTIVE NST.    Hospital Course: The patient was admitted to Labor and Delivery Triage for observation for fetal monitoring per MFM and for evaluation to rule out PPROM. Her NST showed a Category I tracing for both twins. ROM + testing negative, and supported by spec exam which showed negative pooling of fluid. With reactive NSTs and reassurance that she is not ruptured, the patient was discharged home with plans for follow up in 4 days for her scheduled IOL.  Discharge Condition: good  Disposition: Discharge disposition: 01-Home or Self Care       Diet: Regular diet  Discharge Activity: Activity as tolerated   Allergies as of 12/18/2022       Reactions   Soap Rash   Gain Laundry Detergent        Medication List      TAKE these medications    aspirin EC 81 MG tablet Take 162 mg by mouth daily. Swallow whole.   ferrous sulfate 325 (65 FE) MG tablet Take 1 tablet (325 mg total) by mouth daily with breakfast.   prenatal vitamin w/FE, FA 27-1 MG Tabs tablet Take 1 tablet by mouth daily at 12 noon.         Total time spent taking care of this patient: 40 minutes  Signed: Cindra Eves, SNM Mirna Mires, CNM  12/18/2022, 4:54 PM

## 2022-12-19 ENCOUNTER — Ambulatory Visit (INDEPENDENT_AMBULATORY_CARE_PROVIDER_SITE_OTHER): Payer: Medicaid Other

## 2022-12-19 VITALS — BP 134/88 | HR 76 | Wt 200.9 lb

## 2022-12-19 DIAGNOSIS — Z3A35 35 weeks gestation of pregnancy: Secondary | ICD-10-CM

## 2022-12-19 DIAGNOSIS — O98213 Gonorrhea complicating pregnancy, third trimester: Secondary | ICD-10-CM

## 2022-12-19 DIAGNOSIS — O98219 Gonorrhea complicating pregnancy, unspecified trimester: Secondary | ICD-10-CM | POA: Insufficient documentation

## 2022-12-19 DIAGNOSIS — O99119 Other diseases of the blood and blood-forming organs and certain disorders involving the immune mechanism complicating pregnancy, unspecified trimester: Secondary | ICD-10-CM

## 2022-12-19 DIAGNOSIS — O99013 Anemia complicating pregnancy, third trimester: Secondary | ICD-10-CM

## 2022-12-19 DIAGNOSIS — D696 Thrombocytopenia, unspecified: Secondary | ICD-10-CM

## 2022-12-19 DIAGNOSIS — O0990 Supervision of high risk pregnancy, unspecified, unspecified trimester: Secondary | ICD-10-CM

## 2022-12-19 NOTE — Assessment & Plan Note (Signed)
-   Prepared for induction on 9/15.  - Reviewed kick counts and preterm labor warning signs. Instructed to call office or come to hospital with persistent headache, vision changes, regular contractions, leaking of fluid, decreased fetal movement or vaginal bleeding.

## 2022-12-19 NOTE — Assessment & Plan Note (Signed)
-   CBC collected today. She has had thrombocytopenia in previous pregnancies and not been able to have an epidural so is aware of the possibility if her platelets are below the required anesthesia threshold.

## 2022-12-19 NOTE — Progress Notes (Signed)
    Return Prenatal Note   Assessment/Plan   Plan  32 y.o. G3P2002 at [redacted]w[redacted]d presents for follow-up OB visit. Reviewed prenatal record including previous visit note.  Thrombocytopenia affecting pregnancy (HCC) - CBC collected today. She has had thrombocytopenia in previous pregnancies and not been able to have an epidural so is aware of the possibility if her platelets are below the required anesthesia threshold.   Gonorrhea affecting pregnancy Treatment for gonorrhea completed on 9/6.  Supervision of high risk pregnancy, antepartum - Prepared for induction on 9/15.  - Reviewed kick counts and preterm labor warning signs. Instructed to call office or come to hospital with persistent headache, vision changes, regular contractions, leaking of fluid, decreased fetal movement or vaginal bleeding.    Orders Placed This Encounter  Procedures   CBC   No follow-ups on file.   Future Appointments  Date Time Provider Department Center  12/26/2022 11:15 AM Doreene Burke, CNM [facility] None    For next visit:   IOL on 9/15     Subjective   32 y.o. G3P2002 at [redacted]w[redacted]d presents for this follow-up prenatal visit.  Patient has no concerns today. Ready for induction on 9/15.  Patient reports: Movement: Present Contractions: Irregular  Objective   Flow sheet Vitals: Pulse Rate: 76 BP: 134/88 Total weight gain: 14.4 oz (0.408 kg)  General Appearance  No acute distress, well appearing, and well nourished Pulmonary   Normal work of breathing Neurologic   Alert and oriented to person, place, and time Psychiatric   Mood and affect within normal limits  Lindalou Hose Graysen Depaula, CNM  09/12/249:28 AM

## 2022-12-19 NOTE — Assessment & Plan Note (Signed)
Treatment for gonorrhea completed on 9/6.

## 2022-12-20 LAB — CBC
Hematocrit: 32.6 % — ABNORMAL LOW (ref 34.0–46.6)
Hemoglobin: 10.4 g/dL — ABNORMAL LOW (ref 11.1–15.9)
MCH: 27.6 pg (ref 26.6–33.0)
MCHC: 31.9 g/dL (ref 31.5–35.7)
MCV: 87 fL (ref 79–97)
Platelets: 96 10*3/uL — CL (ref 150–450)
RBC: 3.77 x10E6/uL (ref 3.77–5.28)
RDW: 14.3 % (ref 11.7–15.4)
WBC: 8.3 10*3/uL (ref 3.4–10.8)

## 2022-12-21 ENCOUNTER — Other Ambulatory Visit: Payer: Self-pay | Admitting: Obstetrics

## 2022-12-22 ENCOUNTER — Other Ambulatory Visit: Payer: Self-pay | Admitting: Certified Nurse Midwife

## 2022-12-22 ENCOUNTER — Inpatient Hospital Stay
Admission: RE | Admit: 2022-12-22 | Discharge: 2022-12-24 | DRG: 806 | Disposition: A | Discharge status: Home / Self Care | Payer: Medicaid Other | Attending: Certified Nurse Midwife | Admitting: Certified Nurse Midwife

## 2022-12-22 ENCOUNTER — Other Ambulatory Visit: Payer: Self-pay

## 2022-12-22 ENCOUNTER — Encounter: Payer: Self-pay | Admitting: Obstetrics

## 2022-12-22 ENCOUNTER — Inpatient Hospital Stay: Payer: Medicaid Other | Admitting: Anesthesiology

## 2022-12-22 DIAGNOSIS — D6959 Other secondary thrombocytopenia: Secondary | ICD-10-CM | POA: Diagnosis present

## 2022-12-22 DIAGNOSIS — O99013 Anemia complicating pregnancy, third trimester: Secondary | ICD-10-CM | POA: Diagnosis present

## 2022-12-22 DIAGNOSIS — A549 Gonococcal infection, unspecified: Secondary | ICD-10-CM | POA: Diagnosis not present

## 2022-12-22 DIAGNOSIS — O9912 Other diseases of the blood and blood-forming organs and certain disorders involving the immune mechanism complicating childbirth: Secondary | ICD-10-CM | POA: Diagnosis present

## 2022-12-22 DIAGNOSIS — Z8249 Family history of ischemic heart disease and other diseases of the circulatory system: Secondary | ICD-10-CM | POA: Diagnosis not present

## 2022-12-22 DIAGNOSIS — Z3A36 36 weeks gestation of pregnancy: Secondary | ICD-10-CM

## 2022-12-22 DIAGNOSIS — O365931 Maternal care for other known or suspected poor fetal growth, third trimester, fetus 1: Principal | ICD-10-CM

## 2022-12-22 DIAGNOSIS — Z7982 Long term (current) use of aspirin: Secondary | ICD-10-CM

## 2022-12-22 DIAGNOSIS — Z23 Encounter for immunization: Secondary | ICD-10-CM | POA: Diagnosis not present

## 2022-12-22 DIAGNOSIS — O30049 Twin pregnancy, dichorionic/diamniotic, unspecified trimester: Secondary | ICD-10-CM | POA: Diagnosis present

## 2022-12-22 DIAGNOSIS — O98213 Gonorrhea complicating pregnancy, third trimester: Secondary | ICD-10-CM | POA: Diagnosis not present

## 2022-12-22 DIAGNOSIS — Z823 Family history of stroke: Secondary | ICD-10-CM

## 2022-12-22 DIAGNOSIS — O365932 Maternal care for other known or suspected poor fetal growth, third trimester, fetus 2: Secondary | ICD-10-CM | POA: Diagnosis present

## 2022-12-22 DIAGNOSIS — O9902 Anemia complicating childbirth: Secondary | ICD-10-CM

## 2022-12-22 DIAGNOSIS — O365990 Maternal care for other known or suspected poor fetal growth, unspecified trimester, not applicable or unspecified: Secondary | ICD-10-CM | POA: Diagnosis present

## 2022-12-22 DIAGNOSIS — Z818 Family history of other mental and behavioral disorders: Secondary | ICD-10-CM

## 2022-12-22 DIAGNOSIS — D696 Thrombocytopenia, unspecified: Secondary | ICD-10-CM | POA: Diagnosis present

## 2022-12-22 DIAGNOSIS — O365930 Maternal care for other known or suspected poor fetal growth, third trimester, not applicable or unspecified: Secondary | ICD-10-CM

## 2022-12-22 DIAGNOSIS — Z833 Family history of diabetes mellitus: Secondary | ICD-10-CM

## 2022-12-22 DIAGNOSIS — D649 Anemia, unspecified: Secondary | ICD-10-CM | POA: Diagnosis not present

## 2022-12-22 DIAGNOSIS — O99113 Other diseases of the blood and blood-forming organs and certain disorders involving the immune mechanism complicating pregnancy, third trimester: Secondary | ICD-10-CM

## 2022-12-22 DIAGNOSIS — O30043 Twin pregnancy, dichorionic/diamniotic, third trimester: Secondary | ICD-10-CM | POA: Diagnosis present

## 2022-12-22 LAB — CHLAMYDIA/NGC RT PCR (ARMC ONLY)
Chlamydia Tr: NOT DETECTED
N gonorrhoeae: NOT DETECTED

## 2022-12-22 LAB — TYPE AND SCREEN
ABO/RH(D): B POS
Antibody Screen: NEGATIVE

## 2022-12-22 LAB — CBC
HCT: 31.6 % — ABNORMAL LOW (ref 36.0–46.0)
Hemoglobin: 10.6 g/dL — ABNORMAL LOW (ref 12.0–15.0)
MCH: 27.9 pg (ref 26.0–34.0)
MCHC: 33.5 g/dL (ref 30.0–36.0)
MCV: 83.2 fL (ref 80.0–100.0)
Platelets: 101 10*3/uL — ABNORMAL LOW (ref 150–400)
RBC: 3.8 MIL/uL — ABNORMAL LOW (ref 3.87–5.11)
RDW: 16 % — ABNORMAL HIGH (ref 11.5–15.5)
WBC: 8.7 10*3/uL (ref 4.0–10.5)
nRBC: 0 % (ref 0.0–0.2)

## 2022-12-22 MED ORDER — TERBUTALINE SULFATE 1 MG/ML IJ SOLN
0.2500 mg | Freq: Once | INTRAMUSCULAR | Status: DC | PRN
  Filled 2022-12-22: qty 1

## 2022-12-22 MED ORDER — OXYTOCIN BOLUS FROM INFUSION
333.0000 mL | Freq: Once | INTRAVENOUS | Status: AC
  Administered 2022-12-22: 333 mL via INTRAVENOUS

## 2022-12-22 MED ORDER — FENTANYL CITRATE (PF) 100 MCG/2ML IJ SOLN: 50.0000 ug | INTRAMUSCULAR | Status: DC | PRN

## 2022-12-22 MED ORDER — COCONUT OIL OIL
1.0000 | TOPICAL_OIL | Status: DC | PRN
  Filled 2022-12-22: qty 7.5

## 2022-12-22 MED ORDER — LACTATED RINGERS IV SOLN
500.0000 mL | INTRAVENOUS | Status: DC | PRN
  Administered 2022-12-22: 1000 mL via INTRAVENOUS

## 2022-12-22 MED ORDER — CALCIUM CARBONATE ANTACID 500 MG PO CHEW: 2.0000 | CHEWABLE_TABLET | ORAL | Status: DC | PRN

## 2022-12-22 MED ORDER — TRANEXAMIC ACID-NACL 1000-0.7 MG/100ML-% IV SOLN
INTRAVENOUS | Status: AC
  Filled 2022-12-22: qty 100

## 2022-12-22 MED ORDER — TETANUS-DIPHTH-ACELL PERTUSSIS 5-2.5-18.5 LF-MCG/0.5 IM SUSY
0.5000 mL | PREFILLED_SYRINGE | Freq: Once | INTRAMUSCULAR | Status: AC
  Administered 2022-12-24: 0.5 mL via INTRAMUSCULAR
  Filled 2022-12-22 (×2): qty 0.5

## 2022-12-22 MED ORDER — SIMETHICONE 80 MG PO CHEW: 80.0000 mg | CHEWABLE_TABLET | ORAL | Status: DC | PRN

## 2022-12-22 MED ORDER — PHENYLEPHRINE 80 MCG/ML (10ML) SYRINGE FOR IV PUSH (FOR BLOOD PRESSURE SUPPORT): 80.0000 ug | PREFILLED_SYRINGE | INTRAVENOUS | Status: DC | PRN

## 2022-12-22 MED ORDER — ACETAMINOPHEN 325 MG PO TABS: 650.0000 mg | ORAL_TABLET | ORAL | Status: DC | PRN

## 2022-12-22 MED ORDER — ONDANSETRON HCL 4 MG PO TABS: 4.0000 mg | ORAL_TABLET | ORAL | Status: DC | PRN

## 2022-12-22 MED ORDER — LIDOCAINE-EPINEPHRINE (PF) 1.5 %-1:200000 IJ SOLN
INTRAMUSCULAR | Status: DC | PRN
  Administered 2022-12-22: 3 mL via EPIDURAL

## 2022-12-22 MED ORDER — INFLUENZA VIRUS VACC SPLIT PF (FLUZONE) 0.5 ML IM SUSY
0.5000 mL | PREFILLED_SYRINGE | INTRAMUSCULAR | Status: DC
  Filled 2022-12-22: qty 0.5

## 2022-12-22 MED ORDER — SENNOSIDES-DOCUSATE SODIUM 8.6-50 MG PO TABS
2.0000 | ORAL_TABLET | Freq: Every day | ORAL | Status: DC
  Administered 2022-12-23 – 2022-12-24 (×2): 2 via ORAL
  Filled 2022-12-22 (×2): qty 2

## 2022-12-22 MED ORDER — OXYTOCIN-SODIUM CHLORIDE 30-0.9 UT/500ML-% IV SOLN
2.5000 [IU]/h | INTRAVENOUS | Status: DC
  Administered 2022-12-22: 2.5 [IU]/h via INTRAVENOUS
  Filled 2022-12-22 (×2): qty 500

## 2022-12-22 MED ORDER — FENTANYL-BUPIVACAINE-NACL 0.5-0.125-0.9 MG/250ML-% EP SOLN
12.0000 mL/h | EPIDURAL | Status: DC | PRN
  Administered 2022-12-22: 12 mL/h via EPIDURAL

## 2022-12-22 MED ORDER — BENZOCAINE-MENTHOL 20-0.5 % EX AERO
1.0000 | INHALATION_SPRAY | CUTANEOUS | Status: DC | PRN
  Filled 2022-12-22: qty 56

## 2022-12-22 MED ORDER — DIBUCAINE (PERIANAL) 1 % EX OINT
1.0000 | TOPICAL_OINTMENT | CUTANEOUS | Status: DC | PRN
  Filled 2022-12-22: qty 28

## 2022-12-22 MED ORDER — OXYTOCIN 10 UNIT/ML IJ SOLN
INTRAMUSCULAR | Status: AC
  Filled 2022-12-22: qty 2

## 2022-12-22 MED ORDER — EPHEDRINE 5 MG/ML INJ: 10.0000 mg | INTRAVENOUS | Status: DC | PRN

## 2022-12-22 MED ORDER — MISOPROSTOL 50MCG HALF TABLET: 50.0000 ug | ORAL_TABLET | ORAL | Status: DC | PRN

## 2022-12-22 MED ORDER — WITCH HAZEL-GLYCERIN EX PADS
1.0000 | MEDICATED_PAD | CUTANEOUS | Status: DC | PRN
  Filled 2022-12-22: qty 100

## 2022-12-22 MED ORDER — PRENATAL MULTIVITAMIN CH
1.0000 | ORAL_TABLET | Freq: Every day | ORAL | Status: DC
  Administered 2022-12-23 – 2022-12-24 (×2): 1 via ORAL
  Filled 2022-12-22 (×2): qty 1

## 2022-12-22 MED ORDER — MEDROXYPROGESTERONE ACETATE 150 MG/ML IM SUSP
150.0000 mg | Freq: Once | INTRAMUSCULAR | Status: AC
  Administered 2022-12-24: 150 mg via INTRAMUSCULAR
  Filled 2022-12-22: qty 1

## 2022-12-22 MED ORDER — HYDROXYZINE HCL 25 MG PO TABS: 50.0000 mg | ORAL_TABLET | Freq: Four times a day (QID) | ORAL | Status: DC | PRN

## 2022-12-22 MED ORDER — DIPHENHYDRAMINE HCL 25 MG PO CAPS: 25.0000 mg | ORAL_CAPSULE | Freq: Four times a day (QID) | ORAL | Status: DC | PRN

## 2022-12-22 MED ORDER — LACTATED RINGERS IV SOLN
500.0000 mL | Freq: Once | INTRAVENOUS | Status: AC
  Administered 2022-12-22: 500 mL via INTRAVENOUS

## 2022-12-22 MED ORDER — DIPHENHYDRAMINE HCL 50 MG/ML IJ SOLN: 12.5000 mg | INTRAMUSCULAR | Status: DC | PRN

## 2022-12-22 MED ORDER — FENTANYL-BUPIVACAINE-NACL 0.5-0.125-0.9 MG/250ML-% EP SOLN
EPIDURAL | Status: AC
  Filled 2022-12-22: qty 250

## 2022-12-22 MED ORDER — LACTATED RINGERS IV SOLN: INTRAVENOUS | Status: DC

## 2022-12-22 MED ORDER — LIDOCAINE HCL (PF) 1 % IJ SOLN
30.0000 mL | INTRAMUSCULAR | Status: DC | PRN
  Filled 2022-12-22: qty 30

## 2022-12-22 MED ORDER — OXYTOCIN-SODIUM CHLORIDE 30-0.9 UT/500ML-% IV SOLN
1.0000 m[IU]/min | INTRAVENOUS | Status: DC
  Administered 2022-12-22: 2 m[IU]/min via INTRAVENOUS
  Filled 2022-12-22: qty 500

## 2022-12-22 MED ORDER — LIDOCAINE HCL (PF) 1 % IJ SOLN
INTRAMUSCULAR | Status: DC | PRN
  Administered 2022-12-22: 2 mL via SUBCUTANEOUS

## 2022-12-22 MED ORDER — MISOPROSTOL 200 MCG PO TABS
ORAL_TABLET | ORAL | Status: AC
  Filled 2022-12-22: qty 4

## 2022-12-22 MED ORDER — IBUPROFEN 600 MG PO TABS
600.0000 mg | ORAL_TABLET | Freq: Four times a day (QID) | ORAL | Status: DC
  Administered 2022-12-22 – 2022-12-24 (×5): 600 mg via ORAL
  Filled 2022-12-22 (×5): qty 1

## 2022-12-22 MED ORDER — ACETAMINOPHEN 325 MG PO TABS
650.0000 mg | ORAL_TABLET | ORAL | Status: DC | PRN
  Administered 2022-12-22 – 2022-12-23 (×2): 650 mg via ORAL
  Filled 2022-12-22 (×2): qty 2

## 2022-12-22 MED ORDER — ZOLPIDEM TARTRATE 5 MG PO TABS: 5.0000 mg | ORAL_TABLET | Freq: Every evening | ORAL | Status: DC | PRN

## 2022-12-22 MED ORDER — ONDANSETRON HCL 4 MG/2ML IJ SOLN: 4.0000 mg | Freq: Four times a day (QID) | INTRAMUSCULAR | Status: DC | PRN

## 2022-12-22 MED ORDER — AMMONIA AROMATIC IN INHA
RESPIRATORY_TRACT | Status: AC
  Filled 2022-12-22: qty 10

## 2022-12-22 MED ORDER — SOD CITRATE-CITRIC ACID 500-334 MG/5ML PO SOLN: 30.0000 mL | ORAL | Status: DC | PRN

## 2022-12-22 MED ORDER — ONDANSETRON HCL 4 MG/2ML IJ SOLN: 4.0000 mg | INTRAMUSCULAR | Status: DC | PRN

## 2022-12-22 NOTE — Progress Notes (Signed)
Patient arrived to LDR 5, ambulatory, for scheduled induction of labor. Patient reports good fetal movement. Denies leaking of fluid, vaginal bleeding, or contractions. Discussed plan for admission with patient and significant other, Jill Alexanders. Patient and significant other verbalized understanding and agreed to plan.

## 2022-12-22 NOTE — Progress Notes (Signed)
LABOR NOTE   SUBJECTIVE:   Vanessa Francis is a 32 y.o.  G3P2002  at [redacted]w[redacted]d, induced labor. Contractions becoming more uncomfortable, has been using birth ball for comfort/activity. In agreement with AROM. Mother at bedside, supportive.    Analgesia: Labor support without medications  OBJECTIVE:  BP 112/69 (BP Location: Right Arm)   Pulse 79   Temp 97.9 F (36.6 C) (Oral)   Resp 18   Ht 5\' 6"  (1.676 m)   Wt 91.1 kg   LMP 04/22/2022 (Approximate)   SpO2 100%   BMI 32.43 kg/m  Total I/O In: 914 [P.O.:240; I.V.:674] Out: -   SVE:   Dilation: 4 Effacement (%): 80 Station: -2 Exam by:: Hartley Barefoot CNM CONTRACTIONS: regular, every 2-4 minutes FHR: Fetal heart tracing reviewed. Baseline: 120 Variability: moderate Accelerations: present Decelerations:none Category I  Baseline: 140 Variability: moderate Accelerations: present Decelerations:none Category I Labs: Lab Results  Component Value Date   WBC 8.7 12/22/2022   HGB 10.6 (L) 12/22/2022   HCT 31.6 (L) 12/22/2022   MCV 83.2 12/22/2022   PLT 101 (L) 12/22/2022    ASSESSMENT: 1) Induction of labor due to di-di twins with fetal growth restriction,  progressing well on pitocin     Coping: coping well without medication     Membranes: AROM membranes A, scant clear fluid noted, continue to monitor @1515         Principal Problem:   Labor and delivery, indication for care Active Problems:   Twin gestation, dichorionic diamniotic     Overview: -F/u anatomy scan 09/11/22     -Serial growth scans at 24 weeks     -Delivery by 38 weeks   Pregnancy affected by fetal growth restriction   Anemia of pregnancy in third trimester   Thrombocytopenia affecting pregnancy (HCC)   PLAN: Continue pitocin titration per protocol AROM performed Anticipate NSVD   Dominica Severin, CNM 12/22/2022 3:20 PM

## 2022-12-22 NOTE — Progress Notes (Signed)
Difficulty continuously monitoring FHT's during epidural placement. RN remains at bedside. Monitors tracing maternal vs fetal heart tones. Monitors adjusted after patient repositioned to semi fowlers with epidural placement complete.

## 2022-12-22 NOTE — Anesthesia Preprocedure Evaluation (Addendum)
Anesthesia Evaluation  Patient identified by MRN, date of birth, ID band Patient awake    Reviewed: Allergy & Precautions, H&P , NPO status , Patient's Chart, lab work & pertinent test results  Airway Mallampati: II  TM Distance: >3 FB Neck ROM: full    Dental no notable dental hx.    Pulmonary neg pulmonary ROS   Pulmonary exam normal        Cardiovascular Exercise Tolerance: Good negative cardio ROS Normal cardiovascular exam     Neuro/Psych    GI/Hepatic negative GI ROS,,,  Endo/Other    Renal/GU   negative genitourinary   Musculoskeletal   Abdominal  (+) + obese  Peds  Hematology  (+) Blood dyscrasia Thrombocytopenia   Anesthesia Other Findings Past Medical History: 08/03/2020: H/O LEEP 2018 05/28/2016: History of abnormal cervical Pap smear     Comment:  05/16/16 ASCUS HPV +  08/2016 LEEP at [facility]  07/28/19 neg   07/10/2022: Supervision of other normal pregnancy, antepartum     Comment:        Clinical Staff  Provider  Office Location                 [facility]  Dating   12wk [place], mono-di twins                 Language   English  Anatomy US     Flu Vaccine   offer                Genetic Screen   NIPS: neg  TDaP vaccine    offer  Hgb               A1C or   GTT  Early :  Third trimester :   Covid                declined     LAB RESULTS   Rhogam   B/Positive/-- (04/03               1556)   Blood Type  B/Positive/-- (04/03 1556)   Feeding               Plan  breast  Antibody No date: Thrombocytopenia (HCC) 01/23/2015: Thrombocytopenia affecting pregnancy (HCC) No date: Vaginal Pap smear, abnormal     Comment:  2018 or 2019  Past Surgical History: 2018: cervical cancer surgery     Comment:  [facility]  BMI    Body Mass Index: 32.43 kg/m      Reproductive/Obstetrics (+) Pregnancy                             Anesthesia Physical Anesthesia Plan  ASA: 2  Anesthesia Plan: Epidural    Post-op Pain Management:    Induction:   PONV Risk Score and Plan:   Airway Management Planned:   Additional Equipment:   Intra-op Plan:   Post-operative Plan:   Informed Consent: I have reviewed the patients History and Physical, chart, labs and discussed the procedure including the risks, benefits and alternatives for the proposed anesthesia with the patient or authorized representative who has indicated his/her understanding and acceptance.       Plan Discussed with: Anesthesiologist and CRNA  Anesthesia Plan Comments: (She reports that she was not allowed an epidural during previous vaginal deliveries due to thrombocytopenia. A discussion regarding thrombocytopenia was discussed and that her level is acceptable today. She denies having excessive  bleeding episodes. The risks of procedural intervention due to twin gestation is important to consider. She understands and wishes to continue. )       Anesthesia Quick Evaluation

## 2022-12-22 NOTE — Plan of Care (Signed)
Discussed plan of care for induction with pitocin, available pain management options, labor process, and after delivery care. Patient verbalized understanding and agreed to plan. All questions answered at this time.

## 2022-12-22 NOTE — Plan of Care (Signed)
Problem: Education: Goal: Knowledge of Childbirth will improve Outcome: Completed/Met Goal: Ability to make informed decisions regarding treatment and plan of care will improve Outcome: Completed/Met Goal: Ability to state and carry out methods to decrease the pain will improve Outcome: Completed/Met   Problem: Coping: Goal: Ability to verbalize concerns and feelings about labor and delivery will improve Outcome: Completed/Met   Problem: Life Cycle: Goal: Ability to make normal progression through stages of labor will improve Outcome: Completed/Met Goal: Ability to effectively push during vaginal delivery will improve Outcome: Completed/Met   Problem: Role Relationship: Goal: Will demonstrate positive interactions with the child Outcome: Completed/Met   Problem: Safety: Goal: Risk of complications during labor and delivery will decrease Outcome: Completed/Met   Problem: Pain Management: Goal: Relief or control of pain from uterine contractions will improve Outcome: Completed/Met

## 2022-12-22 NOTE — Progress Notes (Signed)
LABOR NOTE   SUBJECTIVE:   Vanessa Francis is a 32 y.o.  G3P2002  at [redacted]w[redacted]d here for scheduled IOL for di-di twins in setting of fetal growth restriction. Shana feeling more contractions, tightening but not painful. Open to epidural if she needs it but has had two unmedicated births and desires this again.   Analgesia: Labor support without medications  OBJECTIVE:  BP 114/81   Pulse 76   Temp 98.5 F (36.9 C) (Oral)   Resp 18   Ht 5\' 6"  (1.676 m)   Wt 91.1 kg   LMP 04/22/2022 (Approximate)   SpO2 100%   BMI 32.43 kg/m  Total I/O In: 291.6 [I.V.:291.6] Out: -   SVE:   Dilation: 3.5 Effacement (%): 60 Station: -2 Exam by:: Hartley Barefoot CNM CONTRACTIONS: regular, every 3 minutes FHR: Fetal heart tracings reviewed. Baseline: 120 Variability: moderate Accelerations: present Decelerations:none Category I  Baseline: 140 Variability: moderate Accelerations: present Decelerations:none Category I Labs: Lab Results  Component Value Date   WBC 8.7 12/22/2022   HGB 10.6 (L) 12/22/2022   HCT 31.6 (L) 12/22/2022   MCV 83.2 12/22/2022   PLT 101 (L) 12/22/2022    ASSESSMENT: 1) Induction of labor due to di-di twins in setting of fetal growth restriction,  progressing well on pitocin     Coping: well without medications, mother at bedside & supportive     Membranes: intact     GBS negative        Principal Problem:   Labor and delivery, indication for care Active Problems:   Twin gestation, dichorionic diamniotic     Overview: -F/u anatomy scan 09/11/22     -Serial growth scans at 24 weeks     -Delivery by 38 weeks   Pregnancy affected by fetal growth restriction   Anemia of pregnancy in third trimester   Thrombocytopenia affecting pregnancy (HCC)   PLAN: Continue pitocin per protocol Will attempt AROM with more anterior cervix & descent of fetal head Anticipate NSVD Dr. Valentino Saxon notified of patient progress and plan of care.  Dominica Severin,  CNM 12/22/2022 1:49 PM

## 2022-12-22 NOTE — H&P (Signed)
[facility] Labor & Delivery  History and Physical   HPI   Chief Complaint: Here for scheduled induction of labor  Vanessa Francis is a 32 y.o. G3P2002 at [redacted]w[redacted]d who presents for induction of labor. Pregnancy complicated by di-di twins, fetal growth restriction, gonorrhea in third trimester, thrombocytopenia, and anemia.  Patient endorses fetal movement, denies loss of fluid or vaginal bleeding. Reports mild, non painful contractions.  Pregnancy Complications Patient Active Problem List   Diagnosis Date Noted   Labor and delivery, indication for care 12/22/2022   Gonorrhea affecting pregnancy 12/19/2022   Twin gestation in third trimester 12/18/2022   Vaginal discharge in pregnancy in third trimester 12/18/2022   Thrombocytopenia affecting pregnancy (HCC) 12/12/2022   Anemia of pregnancy in third trimester 11/14/2022   UTI in pregnancy, antepartum, third trimester 11/14/2022   Pregnancy affected by fetal growth restriction 10/31/2022   Supervision of high risk pregnancy, antepartum 09/10/2022   Twin gestation, dichorionic diamniotic 09/10/2022   Obesity BMI=33.7 08/03/2020    Review of Systems A twelve point review of systems was negative except as stated in HPI.   HISTORY   Medications Medications Prior to Admission  Medication Sig Dispense Refill Last Dose   aspirin EC 81 MG tablet Take 162 mg by mouth daily. Swallow whole.   Past Month   ferrous sulfate 325 (65 FE) MG tablet Take 1 tablet (325 mg total) by mouth daily with breakfast. 90 tablet 3 12/21/2022   prenatal vitamin w/FE, FA (PRENATAL 1 + 1) 27-1 MG TABS tablet Take 1 tablet by mouth daily at 12 noon.   12/21/2022    Allergies is allergic to soap.   OB History OB History  Gravida Para Term Preterm AB Living  3 2 2  0 0 2  SAB IAB Ectopic Multiple Live Births  0 0 0 0 2    # Outcome Date GA Lbr Len/2nd Weight Sex Type Anes PTL Lv  3 Current           2 Term 02/07/15 [redacted]w[redacted]d 06:35 / 00:02  3000 g M Vag-Spont Pud  LIV     Birth Comments: extra digit on left hand     Name: Vanessa, Francis     Apgar1: 8  Apgar5: 9  1 Term 12/25/12 [redacted]w[redacted]d  3118 g F Vag-Spont  N LIV    Past Medical History Past Medical History:  Diagnosis Date   H/O LEEP 2018 08/03/2020   History of abnormal cervical Pap smear 05/28/2016   05/16/16 ASCUS HPV +  08/2016 LEEP at [facility]  07/28/19 neg     Supervision of other normal pregnancy, antepartum 07/10/2022          Clinical Staff  Provider  Office Location   [facility]  Dating   12wk [place], mono-di twins   Language   English  Anatomy US      Flu Vaccine   offer  Genetic Screen   NIPS: neg  TDaP vaccine    offer  Hgb A1C or   GTT  Early :  Third trimester :   Covid  declined     LAB RESULTS   Rhogam   B/Positive/-- (04/03 1556)   Blood Type  B/Positive/-- (04/03 1556)   Feeding Plan  breast  Antibody   Thrombocytopenia (HCC)    Thrombocytopenia affecting pregnancy (HCC) 01/23/2015   Vaginal Pap smear, abnormal    2018 or 2019    Past Surgical History Past Surgical History:  Procedure Laterality Date  cervical cancer surgery  2018   [facility]    Social History  reports that she has never smoked. She has never used smokeless tobacco. She reports that she does not drink alcohol and does not use drugs.   Family History family history includes Anxiety disorder in her sister; Autism in her son; Cancer (age of onset: 38) in her father; Diabetes in her brother and mother; Healthy in her daughter and maternal grandfather; Heart attack in her mother; Heart disease in her maternal grandmother; Hypertension in her maternal grandmother and mother; Stroke in her paternal grandmother.   PHYSICAL EXAM   Vitals:   12/22/22 0852  BP: 127/79  Pulse: 86  Resp: 18  Temp: 98.1 F (36.7 C)  TempSrc: Oral  SpO2: 100%  Weight: 91.1 kg  Height: 5\' 6"  (1.676 m)    Constitutional: No acute distress, well appearing, and well nourished. Neurologic: She is alert and  conversational.  Psychiatric: She has a normal mood and affect.  Musculoskeletal: Normal gait, grossly normal range of motion Cardiovascular: Normal rate.   Pulmonary/Chest: Normal work of breathing.  Gastrointestinal/Abdominal: Soft. Gravid. There is no tenderness.  Skin: Skin is warm and dry. No rash noted.  Genitourinary: Normal external female genitalia.  SVE:   Dilation: 3 Effacement (%): 60 Station: -2 Presentation: Vertex Exam by:: Hartley Barefoot CNM   NST Interpretation  Baseline: 130 bpm Variability: moderate Accelerations: present Decelerations: absent  Baseline: 140 bpm Variability: moderate Accelerations: present Decelerations: absent Contractions: irregular, every 5-10 minutes Time noted:  See OBIX Impression: Reactive x2 Authenticated by: Dominica Severin     ASSESSMENT AND PLAN   Vanessa Francis is a 32 y.o. [id number] at [redacted]w[redacted]d with EDD: 01/19/2023, by Ultrasound admitted for induction of labor in setting of di-di twins with fetal growth restriction.   Pitocin titration per protocol, AROM with established painful contraction pattern.  Fetal Status: - cephalic presentation by SVE, last ultrasound 9/11 demonstrated both twins in cephalic position. - EFW A: 2%/1818g 12/13/22 - EFW B: 1.7%/1852g 12/13/22 - CEFM - FHT currently cat I x2 - Delivery will occur in OR  GBS: - negative  STI in pregnancy - Treated for gonorrhea 12/13/22  Thrombocytopenia in pregnancy - Platelets 101 on admission - Desires epidural  Labs/Immunizations: TDAP: Declined. Flu: Offer postpartum Rubella: immune Varicella: immune HIV: NR Hep B: Negative Hep C: NR RPR: NR GBS: Negative Gonorrhea: positive 9/5, treated with Rocephin 500mg  IM 12/13/22 Chlamydia: Negative  Lab Results  Component Value Date   VZVIGG 1,926 07/10/2022   HIV Non Reactive 10/31/2022     Postpartum Plan: - Feeding: Breast Milk and Formula - Contraception: plans Depo-Provera - Prenatal Care  Provider: [facility]  Attending Dr. Valentino Saxon was immediately available for the care of the patient.

## 2022-12-22 NOTE — Discharge Summary (Signed)
Postpartum Discharge Summary  Date of Service updated: 12/24/2022     Patient Name: Vanessa Francis DOB: January 21, 1991 MRN: [id number]  Date of admission: 12/22/2022 Delivery date:   Vanessa, Francis [[id number]]  12/22/2022    Vanessa, Francis [[id number]]  12/22/2022 Delivering provider:    Earvin Hansen [[id number]]  Vanessa Francis Vanessa Francis, Girl Vanessa Francis [[id number]]  Vanessa Francis Vanessa Francis Date of discharge: 12/24/2022  Admitting diagnosis: Labor and delivery, indication for care [O75.9] Intrauterine pregnancy: [redacted]w[redacted]d     Secondary diagnosis:  Principal Problem:   Labor and delivery, indication for care Active Problems:   Twin gestation, dichorionic diamniotic   Pregnancy affected by fetal growth restriction   Anemia of pregnancy in third trimester   Thrombocytopenia affecting pregnancy (HCC)   Postpartum care following vaginal delivery  Additional problems: none    Discharge diagnosis:  Preterm twin pregnancy delivered                                               Post partum procedures: none Augmentation: AROM and Pitocin Complications: None  Hospital course: Induction of Labor With Vaginal Delivery   32 y.o. yo [id number] at [redacted]w[redacted]d was admitted to the hospital 12/22/2022 for induction of labor.  Indication for induction:  di-di twins, fetal growth restriction .    Membrane Rupture Time/Date:    Vanessa, Francis [[id number]]  3:15 PM    Vanessa, Francis [[id number]]  7:42 PM,   Vanessa, Francis [[id number]]  12/22/2022    Vanessa, Francis [[id number]]  12/22/2022  Delivery Method:   Earvin Hansen [[id number]]  Vaginal, Spontaneous    Vanessa, [place] Girl MEE Francis [[id number]]  Vaginal, Spontaneous Operative Delivery:N/A Episiotomy:    Vanessa, Francis [[id number]]  None    Vanessa, Francis [[id number]]  None Lacerations:     Vanessa, Francis [[id number]]  None    Vanessa, Zarza Girl QUINCI Francis  [[id number]]  None Details of delivery can be found in separate delivery note.    Patient had a postpartum course complicated by NA.  She is tolerating regular diet, her pain is controlled with PO medication, she is ambulating and voiding without difficulty. Babies are formula feeding and doing well in the room with her.  She will room in with newborns if they are not ready for discharge today.  Patient is discharged home 12/24/22.  Newborn Data: Birth date:   Vanessa, Francis [[id number]]  12/22/2022    Vanessa, Francis [[id number]]  12/22/2022 Birth time:   Vanessa, Francis [[id number]]  7:22 PM    Vanessa Francis [[id number]]  7:43 PM Gender:   Vanessa, Francis [[id number]]  Female    Vanessa, [place] Girl DYNESHIA Francis [[id number]]  Female Living status:   Vanessa, Francis [[id number]]  Living    Vanessa, [place] Girl Vanessa Francis [[id number]]  Living Apgars:   Vanessa, Francis [[id number]]  [address] Vanessa Francis [[id number]]  7 ,   Vanessa, Francis [[id number]]  [address] [[id number]]  9  Weight:   Vanessa, Francis [[id number]]  2120 g    Vanessa, Francis [[id number]]  2130 g  Magnesium Sulfate  received: No BMZ received: No Rhophylac:N/A MMR:N/A T-DaP: offer Flu: No Transfusion:No  Physical exam  Vitals:   12/23/22 0800 12/23/22 1600 12/23/22 2313 12/24/22 0902  BP: 135/88 132/85 114/62 135/82  Pulse: 69 73 76 67  Resp: 18 18 18 20   Temp: 98.3 F (36.8 C) 98.2 F (36.8 C) 98.2 F (36.8 C) 98.4 F (36.9 C)  TempSrc: Oral Oral Oral Oral  SpO2: 100% 100% 99% 100%  Weight:      Height:       General: alert, cooperative, and no distress Lochia: appropriate Uterine Fundus: firm Incision: N/A DVT Evaluation: No evidence of DVT seen on physical exam.  Labs: Lab Results  Component Value Date   WBC 10.9 (H) 12/23/2022   HGB 9.6 (Vanessa Francis) 12/23/2022   HCT 29.0 (Vanessa Francis) 12/23/2022   MCV 83.8 12/23/2022    PLT 92 (Vanessa Francis) 12/23/2022      Latest Ref Rng & Units 01/23/2015    3:06 PM  CMP  Glucose 65 - 99 mg/dL 70   BUN 6 - 20 mg/dL 7   Creatinine 1.61 - 0.96 mg/dL 0.45   Sodium 409 - 811 mmol/Vanessa Francis 132   Potassium 3.5 - 5.1 mmol/Vanessa Francis 3.8   Chloride 101 - 111 mmol/Vanessa Francis 107   CO2 22 - 32 mmol/Vanessa Francis 19   Calcium 8.9 - 10.3 mg/dL 8.9   Total Protein 6.5 - 8.1 g/dL 6.8   Total Bilirubin 0.3 - 1.2 mg/dL 0.8   Alkaline Phos 38 - 126 U/Vanessa Francis 90   AST 15 - 41 U/Vanessa Francis 18   ALT 14 - 54 U/Vanessa Francis 11    Edinburgh Score:    12/23/2022   12:50 AM  Edinburgh Postnatal Depression Scale Screening Tool  I have been able to laugh and see the funny side of things. --      After visit meds:  Allergies as of 12/24/2022       Reactions   Soap Rash   Gain Laundry Detergent        Medication List     STOP taking these medications    aspirin EC 81 MG tablet       TAKE these medications    ferrous sulfate 325 (65 FE) MG tablet Take 1 tablet (325 mg total) by mouth daily with breakfast.   prenatal vitamin w/FE, FA 27-1 MG Tabs tablet Take 1 tablet by mouth daily at 12 noon.         Discharge home in stable condition Infant Feeding: Bottle and Breast Infant Disposition: pending pediatrician discharge Discharge instruction: per After Visit Summary and Postpartum booklet. Activity: Advance as tolerated. Pelvic rest for 6 weeks.  Diet: routine diet Anticipated Birth Control: Depo Postpartum Appointment:6 weeks Additional Postpartum F/U: Postpartum Depression checkup Future Appointments: Future Appointments  Date Time Provider Department Center  12/26/2022 11:15 AM Doreene Burke, CNM [facility] None   Follow up Visit:  Follow-up Information     Dominica Severin, CNM Follow up.   Specialty: Certified Nurse Midwife Why: 2w for virtual visit for mood check, 6w for postpartum visit. Contact information: [address] [place] [address] [phone]                  12/24/2022 Tresea Mall, CNM

## 2022-12-22 NOTE — Anesthesia Procedure Notes (Signed)
Epidural Patient location during procedure: OB Start time: 12/22/2022 4:12 PM End time: 12/22/2022 4:25 PM  Staffing Anesthesiologist: Foye Deer, MD Performed: anesthesiologist   Preanesthetic Checklist Completed: patient identified, IV checked, site marked, risks and benefits discussed, surgical consent, monitors and equipment checked, pre-op evaluation and timeout performed  Epidural Patient position: sitting Prep: ChloraPrep Patient monitoring: heart rate, continuous pulse ox and blood pressure Approach: midline Location: L3-L4 Injection technique: LOR saline  Needle:  Needle type: Tuohy  Needle gauge: 18 G Needle length: 9 cm Needle insertion depth: 8 cm Catheter type: closed end Catheter size: 20 Guage Catheter at skin depth: 13 cm Test dose: negative and 1.5% lidocaine with Epi 1:200 K  Assessment Events: blood not aspirated, no cerebrospinal fluid, injection not painful, no injection resistance and no paresthesia  Additional Notes Reason for block:procedure for pain

## 2022-12-23 ENCOUNTER — Encounter: Payer: Self-pay | Admitting: Obstetrics

## 2022-12-23 LAB — CBC
HCT: 29 % — ABNORMAL LOW (ref 36.0–46.0)
Hemoglobin: 9.6 g/dL — ABNORMAL LOW (ref 12.0–15.0)
MCH: 27.7 pg (ref 26.0–34.0)
MCHC: 33.1 g/dL (ref 30.0–36.0)
MCV: 83.8 fL (ref 80.0–100.0)
Platelets: 92 10*3/uL — ABNORMAL LOW (ref 150–400)
RBC: 3.46 MIL/uL — ABNORMAL LOW (ref 3.87–5.11)
RDW: 15.9 % — ABNORMAL HIGH (ref 11.5–15.5)
WBC: 10.9 10*3/uL — ABNORMAL HIGH (ref 4.0–10.5)
nRBC: 0 % (ref 0.0–0.2)

## 2022-12-23 LAB — RPR: RPR Ser Ql: NONREACTIVE

## 2022-12-23 NOTE — Progress Notes (Signed)
Progress Note - Vaginal Delivery  Vanessa Francis is a 32 y.o. [phone] now PP day 1 s/p    Vanessa Francis [[id number]]  Vaginal, Spontaneous    Vanessa Francis [[id number]]  Vaginal, Spontaneous.   Subjective:  The patient reports no complaints, up ad lib, voiding, and tolerating PO   Objective:  Vital signs in last 24 hours: Temp:  [97.8 F (36.6 C)-98.5 F (36.9 C)] 98.3 F (36.8 C) (09/16 0800) Pulse Rate:  [61-144] 69 (09/16 0800) Resp:  [16-20] 18 (09/16 0800) BP: (105-141)/(57-88) 135/88 (09/16 0800) SpO2:  [99 %-100 %] 100 % (09/16 0800)  Physical Exam:  General: alert, cooperative, appears stated age, and no distress Lochia: appropriate Uterine Fundus: firm    Data Review Recent Labs    12/22/22 0914 12/23/22 0549  HGB 10.6* 9.6*  HCT 31.6* 29.0*    Assessment/Plan: Principal Problem:   Labor and delivery, indication for care Active Problems:   Twin gestation, dichorionic diamniotic     Overview: -F/u anatomy scan 09/11/22     -Serial growth scans at 24 weeks     -Delivery by 38 weeks   Pregnancy affected by fetal growth restriction   Anemia of pregnancy in third trimester   Thrombocytopenia affecting pregnancy (HCC)   Plan for discharge tomorrow   -- Continue routine PP care.     Doreene Burke, CNM  12/23/2022 9:55 AM

## 2022-12-23 NOTE — Anesthesia Postprocedure Evaluation (Signed)
Anesthesia Post Note  Patient: Vanessa Francis  Procedure(s) Performed: AN AD HOC LABOR EPIDURAL  Patient location during evaluation: Mother Baby Anesthesia Type: Epidural Level of consciousness: oriented and awake and alert Pain management: pain level controlled Vital Signs Assessment: post-procedure vital signs reviewed and stable Respiratory status: spontaneous breathing and respiratory function stable Cardiovascular status: blood pressure returned to baseline and stable Postop Assessment: no headache, no backache, no apparent nausea or vomiting and able to ambulate Anesthetic complications: no   No notable events documented.   Last Vitals:  Vitals:   12/23/22 0310 12/23/22 0800  BP: (!) 141/87 135/88  Pulse: 66 69  Resp: 20 18  Temp: 36.9 C 36.8 C  SpO2: 100% 100%    Last Pain:  Vitals:   12/23/22 0800  TempSrc: Oral  PainSc:                  Starling Manns

## 2022-12-23 NOTE — Lactation Note (Signed)
This note was copied from a baby's chart. Lactation Consultation Note  Patient Name: Boy A Aika Depies YIRSW'N Date: 12/23/2022 Age:33 hours Reason for consult: Initial assessment;Late-preterm 34-36.6wks;Multiple gestation  Lactation Rounds: LC to Mother's room. Mother was asked her feeding plan. She states she is probably just going to formula feed. Mother states she was latching some last night, and with latching and the hand expression it was not comfortable. LC explained option for pumping and providing but at this time Mother declines wanting to pump. Discussed engorgement and treatment for when mature milk transitions in.  LC# on board, if Mother has any needs or questions.   Maternal Data Does the patient have breastfeeding experience prior to this delivery?: No  Feeding Mother's Current Feeding Choice: Formula Nipple Type: Slow - flow  Interventions Interventions: Breast feeding basics reviewed (discussed options for pumping, declines at this time)  Discharge Discharge Education: Engorgement and breast care  Consult Status Consult Status: PRN    Canna Nickelson D Lavanya Roa 12/23/2022, 9:52 AM

## 2022-12-23 NOTE — Plan of Care (Signed)
Transferred to Room 339. Alert and oriented with pleasant affect. VS/Assessment WNL. Oriented to Room, Safety and Security, Risk for Fall and Pt. To call for assist prior to ambulation and POC. Pt. V/O.

## 2022-12-23 NOTE — TOC Initial Note (Signed)
Transition of Care [facility]) - Initial/Assessment Note    Patient Details  Name: Vanessa Francis MRN: [id number] Date of Birth: [birth year]  Transition of Care [facility]) CM/SW Contact:    Darolyn Rua, LCSW Phone Number: 12/23/2022, 3:06 PM  Clinical Narrative:                  CSW spoke with patient following TOC Consult for SDOH Questions.   She reports no needs at this time. States that her sister provides support at home if she needs anything. She reports having supplies at home for babies.   She states she has bassinetts and reports no food insecurities or transportation issues. She reports having her own vehicle and has car seats at bedside. Reports not having any needs at discharge unless the unit has additional premie clothes as she was not expecting babys to be so small. CSW will follow up with unit prior to discharge to identify any additional clothing.   She reports choosing [facility] for [facility] follow up.   Patient reports no needs at this time.   Expected Discharge Plan: Home/Self Care Barriers to Discharge: Continued Medical Work up   Patient Goals and CMS Choice Patient states their goals for this hospitalization and ongoing recovery are:: to go home CMS Medicare.gov Compare Post Acute Care list provided to:: Patient Choice offered to / list presented to : Patient      Expected Discharge Plan and Services       Living arrangements for the past 2 months: Single Family Home                                      Prior Living Arrangements/Services Living arrangements for the past 2 months: Single Family Home Lives with:: Relatives                   Activities of Daily Living Home Assistive Devices/Equipment: None ADL Screening (condition at time of admission) Patient's cognitive ability adequate to safely complete daily activities?: Yes Is the patient deaf or have difficulty hearing?: No Does the patient have difficulty seeing, even when  wearing glasses/contacts?: No Does the patient have difficulty concentrating, remembering, or making decisions?: No Patient able to express need for assistance with ADLs?: Yes Does the patient have difficulty dressing or bathing?: No Independently performs ADLs?: Yes (appropriate for developmental age) Does the patient have difficulty walking or climbing stairs?: No Weakness of Legs: None Weakness of Arms/Hands: None  Permission Sought/Granted                  Emotional Assessment              Admission diagnosis:  Labor and delivery, indication for care [O75.9] Patient Active Problem List   Diagnosis Date Noted   Labor and delivery, indication for care 12/22/2022   Gonorrhea affecting pregnancy 12/19/2022   Twin gestation in third trimester 12/18/2022   Vaginal discharge in pregnancy in third trimester 12/18/2022   Thrombocytopenia affecting pregnancy (HCC) 12/12/2022   Anemia of pregnancy in third trimester 11/14/2022   UTI in pregnancy, antepartum, third trimester 11/14/2022   Pregnancy affected by fetal growth restriction 10/31/2022   Supervision of high risk pregnancy, antepartum 09/10/2022   Twin gestation, dichorionic diamniotic 09/10/2022   Obesity BMI=33.7 08/03/2020   PCP:  Lorn Junes, FNP Pharmacy:   [facility] [address]), [place] - [phone]  [address]) [place] [address] Phone: [phone] Fax: [phone]     Social Determinants of Health (SDOH) Social History: SDOH Screenings   Food Insecurity: Food Insecurity Present (12/22/2022)  Housing: Medium Risk (12/22/2022)  Transportation Needs: No Transportation Needs (12/22/2022)  Utilities: At Risk (12/22/2022)  Depression (PHQ2-9): Low Risk  (10/30/2020)  Financial Resource Strain: Medium Risk (07/10/2022)  Physical Activity: Insufficiently Active (07/10/2022)  Social Connections: Moderately Integrated (07/10/2022)  Stress: No Stress Concern  Present (07/10/2022)  Tobacco Use: Low Risk  (12/22/2022)   SDOH Interventions:     Readmission Risk Interventions     No data to display

## 2022-12-24 ENCOUNTER — Telehealth: Payer: Self-pay

## 2022-12-24 MED ORDER — IBUPROFEN 600 MG PO TABS
600.0000 mg | ORAL_TABLET | Freq: Four times a day (QID) | ORAL | Status: DC
  Administered 2022-12-24 (×3): 600 mg via ORAL
  Filled 2022-12-24 (×3): qty 1

## 2022-12-24 NOTE — Plan of Care (Signed)
PT will discharge tonight. Pain and bleeding controlled. D/c instructions given and pt verbalized understanding.

## 2022-12-24 NOTE — Discharge Instructions (Signed)

## 2022-12-24 NOTE — Telephone Encounter (Signed)
[facility]- Discharge Call Backs-Left Voicemail about the following below. 1-Do you have any questions or concerns about yourself as you heal?  C-Sec-Is your dressing off?/Vag Del? 2-Any concerns or questions about your baby? Is your baby eating, peeing,pooping well? 3-Where does your baby sleep in your home? Reviewed ABC's of safe sleep. 4-How was your stay at the hospital? 5- Did our team work together to care for you? You should be receiving a survey in the mail soon.   We would really appreciate it if you could fill that out for [place] and return it in the mail.  We value the feedback to make improvements and continue the great work we do.   If you have any questions please feel free to call me back at [phone]

## 2022-12-26 ENCOUNTER — Encounter: Payer: Medicaid Other | Admitting: Certified Nurse Midwife

## 2023-01-02 LAB — SURGICAL PATHOLOGY

## 2023-01-07 ENCOUNTER — Encounter: Payer: Self-pay | Admitting: Certified Nurse Midwife

## 2023-01-07 ENCOUNTER — Telehealth (INDEPENDENT_AMBULATORY_CARE_PROVIDER_SITE_OTHER): Payer: Medicaid Other | Admitting: Certified Nurse Midwife

## 2023-01-07 DIAGNOSIS — Z1331 Encounter for screening for depression: Secondary | ICD-10-CM

## 2023-01-07 NOTE — Progress Notes (Signed)
   Virtual Visit via Video Note  I connected with Vanessa Francis on 01/07/23 at   3:55 PM EDT by a video enabled telemedicine application and verified that I am speaking with the correct person using two identifiers.  Location: Patient: home Provider: [facility]   I discussed the limitations of evaluation and management by telemedicine and the availability of in person appointments. The patient expressed understanding and agreed to proceed.    History of Present Illness:   Vanessa Francis is a 32 y.o. G20P2104 female who presents for a 2 week televisit for mood check. She is 2 weeks postpartum following a spontaneous vaginal delivery of twins.  The delivery was at 36 gestational weeks.  Postpartum course has been well so far. Babies are both feeding by breast & bottle & gaining weight, both now at 5lb! Bleeding: scant to light without clots or odor. Postpartum depression screening: negative.  EDPS score is 6.      The following portions of the patient's history were reviewed and updated as appropriate: allergies, current medications, past family history, past medical history, past social history, past surgical history, and problem list.   Observations/Objective:   Last menstrual period 04/22/2022, unknown if currently breastfeeding. Gen App: NAD Psych: normal speech, affect. Good mood.        01/07/2023    4:01 PM 12/24/2022    2:11 PM 12/23/2022   12:50 AM 07/10/2022    1:35 PM  Edinburgh Postnatal Depression Scale Screening Tool  I have been able to laugh and see the funny side of things. 0 0 -- 0  I have looked forward with enjoyment to things. 0 1  0  I have blamed myself unnecessarily when things went wrong. 0 1  0  I have been anxious or worried for no good reason. 2 2  2   I have felt scared or panicky for no good reason. 0 2  0  Things have been getting on top of me. 2 2  2   I have been so unhappy that I have had difficulty sleeping. 2 0  0  I have felt sad or miserable.  0 0  0  I have been so unhappy that I have been crying. 0 0  0  The thought of harming myself has occurred to me. 0 0  0  Edinburgh Postnatal Depression Scale Total 6 8  4          Assessment and Plan:   1. Encounter for screening for maternal depression - Screening Negative today. Will rescreen at 6 week postpartum visit.     2. Postpartum state - Overall doing well. Continue routine postpartum home care.    3. Lactating mother - continue feeding on demand & supplementing prn  4. Contraception - Depo administered prior to hospital discharge   Follow Up Instructions:     I discussed the assessment and treatment plan with the patient. The patient was provided an opportunity to ask questions and all were answered. The patient agreed with the plan and demonstrated an understanding of the instructions.   The patient was advised to call back or seek an in-person evaluation if the symptoms worsen or if the condition fails to improve as anticipated.   Dominica Severin, CNM

## 2023-01-13 ENCOUNTER — Other Ambulatory Visit: Payer: Medicaid Other

## 2023-01-21 ENCOUNTER — Encounter: Payer: Self-pay | Admitting: Obstetrics

## 2023-01-22 ENCOUNTER — Encounter: Payer: Self-pay | Admitting: Certified Nurse Midwife

## 2023-01-22 ENCOUNTER — Telehealth: Payer: Self-pay

## 2023-01-22 NOTE — Telephone Encounter (Signed)
Pt called triage asking if it was okay for her to return to work before her official 6 weeks are over. Patient is trying to go back next week. 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 Pt will let us know if she needs a return to work note.

## 2023-02-06 ENCOUNTER — Ambulatory Visit: Payer: Medicaid Other | Admitting: Certified Nurse Midwife

## 2023-02-06 DIAGNOSIS — Z1332 Encounter for screening for maternal depression: Secondary | ICD-10-CM

## 2023-02-06 DIAGNOSIS — Z1331 Encounter for screening for depression: Secondary | ICD-10-CM

## 2023-02-06 NOTE — Progress Notes (Signed)
Post Partum Visit Note  Vanessa Francis is a 32 y.o. [phone] female who presents for a postpartum visit. She is 6 weeks postpartum following a normal spontaneous vaginal delivery.  I have fully reviewed the prenatal and intrapartum course. The delivery was at 36 gestational weeks of di/di twins.  Anesthesia: epidural. Postpartum course has been uncomplicated. 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 Babies are both doing well and feeding by  bottle . Bleeding no bleeding. Bowel function is normal. Bladder function is normal. Patient is not sexually active. Contraception method is Depo-Provera injections. Postpartum depression screening: negative. 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 She has support from her partner but due to his work schedule is not always available.   Upstream - 02/06/23 1139       Pregnancy Intention Screening   Does the patient want to become pregnant in the next year? No    Does the patient's partner want to become pregnant in the next year? No    Would the patient like to discuss contraceptive options today? N/A      Contraception Wrap Up   Current Method Hormonal Injection    End Method Hormonal Injection    Contraception Counseling Provided No    How was the end contraceptive method provided? N/A   Next depo due 12/3-12/17           The pregnancy intention screening data noted above was reviewed. Potential methods of contraception were discussed. The patient elected to proceed with Hormonal Injection.   Edinburgh Postnatal Depression Scale - 02/06/23 1053       Edinburgh Postnatal Depression Scale:  In the Past 7 Days   I have been able to laugh and see the funny side of things. 0    I have looked forward with enjoyment to things. 1    I have blamed myself unnecessarily when things went wrong. 2    I have been anxious or worried for no good  reason. 2    I have felt scared or panicky for no good reason. 0    Things have been getting on top of me. 2    I have been so unhappy that I have had difficulty sleeping. 2    I have felt sad or miserable. 1    I have been so unhappy that I have been crying. 0    The thought of harming myself has occurred to me. 0    Edinburgh Postnatal Depression Scale Total 10             Health Maintenance Due  Topic Date Due   INFLUENZA VACCINE  Never done   COVID-19 Vaccine (1 - 2023-24 season) Never done    The following portions of the patient's history were reviewed and updated as appropriate: allergies, current medications, past family history, past medical history, past social history, past surgical history, and problem list.  Review of Systems Pertinent items are noted in HPI.  Objective:  BP 139/88   Pulse 80   Wt 183 lb 14.4 oz (83.4 kg)   LMP 04/22/2022 (Approximate)   Breastfeeding No   BMI 29.68 kg/m    General:  alert, cooperative, appears stated age, and no distress   Breasts:  normal  Lungs: clear to auscultation bilaterally  Heart:  regular rate and rhythm, S1, S2 normal, no murmur, click, rub or  gallop  Abdomen: Soft, nontender, no DR    GU exam:  normal       Assessment:    1. Routine postpartum follow-up   2. Depression screen EPDS 10, referral to Gayland Curry, RN to help set up counseling given multiple life stressors.   Normal postpartum exam.   Plan:   Essential components of care per ACOG recommendations:  1.  Mood and well being: Patient with negative depression screening today. Reviewed local resources for support.  - Patient tobacco use? No.   - hx of drug use? No.    2. Infant care and feeding:  -Patient currently breastmilk feeding? No.  -Social determinants of health (SDOH) reviewed in EPIC. No concerns  3. Sexuality, contraception and birth spacing - Patient does not want a pregnancy in the next year.   - Reviewed reproductive life  planning. Reviewed contraceptive methods based on pt preferences and effectiveness.  Patient desired Hormonal Injection today.     4. Sleep and fatigue -Encouraged family/partner/community support of 4 hrs of uninterrupted sleep to help with mood and fatigue  5. Physical Recovery  - Discussed patients delivery and complications. She describes her labor as good. - Patient had a Vaginal, no problems at delivery.  - Patient has urinary incontinence? No. - Patient is safe to resume physical and sexual activity  6.  Health Maintenance - HM due items addressed Yes - Last pap smear  Diagnosis  Date Value Ref Range Status  07/17/2022   Final   - Negative for intraepithelial lesion or malignancy (NILM)   Pap smear not done at today's visit.  -Breast Cancer screening indicated? No.   7. Chronic Disease/Pregnancy Condition follow up: None  - PCP follow up  Dominica Severin, CNM [facility], [facility]

## 2023-02-10 ENCOUNTER — Telehealth: Payer: Self-pay | Admitting: Licensed Clinical Social Worker

## 2023-02-10 NOTE — Telephone Encounter (Signed)
-----   Message from Lawson Fiscal sent at 02/10/2023 11:04 AM EST ----- Regarding: FW: counseling referral Marchelle Folks - can we have you reach out and offer MH counseling for this lady .   I will call her today and also check in - I appreciate this if you are available to have her see you for counseling .    Gayland Curry ----- Message ----- From: Dominica Severin, CNM Sent: 02/06/2023  11:58 AM EST To: Crista Luria Subject: counseling referral                            Hi Angie, Vanessa Francis had twins, a boy & a girl, 9/15. I saw her for her pp visit today. Her EPDS is 10, so not depressed but she is feeling overwhelmed. She is interested in counseling and I'm hoping we can still refer her through you. Babies were born at 36w due to IUGR. 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 As well as her 8yo son has developmental delays & autism. 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 Thanks, Shanda Bumps

## 2023-03-11 ENCOUNTER — Ambulatory Visit (INDEPENDENT_AMBULATORY_CARE_PROVIDER_SITE_OTHER): Payer: Medicaid Other

## 2023-03-11 VITALS — BP 138/93 | HR 85 | Wt 188.0 lb

## 2023-03-11 DIAGNOSIS — Z3042 Encounter for surveillance of injectable contraceptive: Secondary | ICD-10-CM | POA: Diagnosis not present

## 2023-03-11 MED ORDER — MEDROXYPROGESTERONE ACETATE 150 MG/ML IM SUSP
150.0000 mg | Freq: Once | INTRAMUSCULAR | Status: AC
Start: 2023-03-11 — End: 2023-03-11
  Administered 2023-03-11: 150 mg via INTRAMUSCULAR

## 2023-03-11 NOTE — Patient Instructions (Signed)
Contraceptive Injection A contraceptive injection is a shot that prevents pregnancy. It is also called a birth control shot. The shot contains the hormone progestin, which prevents pregnancy by: Stopping the ovaries from releasing eggs. Thickening cervical mucus to prevent sperm from entering the cervix. Thinning the lining of the uterus to prevent a fertilized egg from attaching to the uterus. Contraceptive injections are given under the skin (subcutaneous) or into a muscle (intramuscular). For these shots to work, you must get one of them every 3 months (12-13 weeks) from a health care provider. Tell a health care provider about: Any allergies you have. All medicines you are taking, including vitamins, herbs, eye drops, creams, and over-the-counter medicines. Any blood disorders you have. Any medical conditions you have. Whether you are pregnant or may be pregnant. What are the risks? Generally, this is a safe procedure. However, problems may occur, including: Mood changes or depression. Loss of bone density (osteoporosis) after long-term use. Blood clots. This is rare. Higher risk of an egg being fertilized outside your uterus (ectopic pregnancy).This is rare. What happens before the procedure? Your health care provider may do a routine physical exam. You may have a test to make sure you are not pregnant. What happens during the procedure?  The area where the shot will be given will be cleaned and sanitized with alcohol. A needle will be inserted into a muscle in your upper arm or buttock, or into the skin of your thigh or abdomen. The needle will be attached to a syringe with the medicine inside of it. The medicine will be pushed through the syringe and injected into your body. A small bandage (dressing) may be placed over the injection site. What can I expect after the procedure? After the procedure, it is common to have: Soreness around the injection site for a couple of  days. Irregular menstrual bleeding. Weight gain. Breast tenderness. Headaches. Discomfort in your abdomen. Ask your health care provider whether you need to use an added method of birth control (backup contraception), such as a condom, sponge, or spermicide. If the first shot is given 1-7 days after the start of your last menstrual period, you will not need backup contraception. If the first shot is given at any other time during your menstrual cycle, you should avoid having sex. If you do have sex, you will need to use backup contraception for 7 days after you receive the shot. Follow these instructions at home: General instructions Take over-the-counter and prescription medicines only as told by your health care provider. Do not rub or massage the injection site. Track your menstrual periods so you will know if they become irregular. Always use a condom to protect against sexually transmitted infections (STIs). Make sure you schedule an appointment in time for your next shot and mark it on your calendar. You must get an injection every 3 months (12-13 weeks) to prevent pregnancy. Lifestyle Do not use any products that contain nicotine or tobacco. These products include cigarettes, chewing tobacco, and vaping devices, such as e-cigarettes. If you need help quitting, ask your health care provider. Eat foods that are high in calcium and vitamin D, such as milk, cheese, and salmon. Doing this may help with any loss in bone density caused by the contraceptive injection. Ask your health care provider for dietary recommendations. Contact a health care provider if you: Have nausea or vomiting. Have abnormal vaginal discharge or bleeding. Miss a menstrual period or think you might be pregnant. Experience mood changes   or depression. Feel dizzy or light-headed. Have leg pain. Get help right away if you: Have chest pain or cough up blood. Have shortness of breath. Have a severe headache that does  not go away. Have numbness in any part of your body. Have slurred speech or vision problems. Have vaginal bleeding that is abnormally heavy or does not stop, or you have severe pain in your abdomen. Have depression that does not get better with treatment. If you ever feel like you may hurt yourself or others, or have thoughts about taking your own life, get help right away. Go to your nearest emergency department or: Call your local emergency services (911 in the [place] Call a suicide crisis helpline, such as the National Suicide Prevention Lifeline at 1-800-273-8255 or 988 in the [place] This is open 24 hours a day in the [place] Text the Crisis Text Line at 741741 (in the [place] Summary A contraceptive injection is a shot that prevents pregnancy. It is also called the birth control shot. The shot is given under the skin (subcutaneous) or into a muscle (intramuscular). After this procedure, it is common to have soreness around the injection site for a couple of days. To prevent pregnancy, the shot must be given by a health care provider every 3 months (12-13 weeks). After you have the shot, ask your health care provider whether you need to use an added method of birth control (backup contraception), such as a condom, sponge, or spermicide. This information is not intended to replace advice given to you by your health care provider. Make sure you discuss any questions you have with your health care provider. Document Revised: 10/18/2020 Document Reviewed: 10/04/2019 Elsevier Patient Education  2024 Elsevier Inc.  

## 2023-03-11 NOTE — Progress Notes (Signed)
    NURSE VISIT NOTE  Subjective:    Patient ID: Clide Cliff, female    DOB: [birth year], 32 y.o.   MRN: [id number]  HPI  Patient is a 32 y.o. G28P2103 female who presents for depo provera injection.   Objective:    LMP 04/22/2022 (Approximate)   Last Annual: 02/06/23 6weeks postpartum. Last pap: 07/17/22. Last Depo-Provera: 12/22/22. Side Effects if any: none. Serum HCG indicated? No . Depo-Provera 150 mg IM given by: Cornelius Moras, CMA. Site: Right Deltoid  Lab Review  @THIS  VISIT ONLY@  Assessment:   No diagnosis found.   Plan:   Next appointment due between 05/24/22 and 06/08/22.    Cornelius Moras, CMA

## 2023-03-12 ENCOUNTER — Ambulatory Visit: Payer: Medicaid Other | Admitting: Licensed Clinical Social Worker

## 2023-03-26 ENCOUNTER — Telehealth: Payer: Self-pay | Admitting: Licensed Clinical Social Worker

## 2023-03-26 NOTE — Telephone Encounter (Signed)
LCSW called and lvm for patient regarding opening for sooner appt. - 03/27/23 at 330pm. LCSW encouraged patient to return call if she would like to reschedule her appt. From 04/16/23 to a sooner appt.

## 2023-04-16 ENCOUNTER — Ambulatory Visit: Payer: Medicaid Other | Admitting: Licensed Clinical Social Worker

## 2023-04-29 ENCOUNTER — Telehealth: Payer: Self-pay | Admitting: Licensed Clinical Social Worker

## 2023-04-29 NOTE — Telephone Encounter (Signed)
LCSW notified that patient requested to reschedule appt. LCSW attempted to call patient to reschedule. A VM was left for patient to return call if she would like to reschedule. LCSW will cancel appointment.

## 2023-04-30 ENCOUNTER — Ambulatory Visit: Payer: Medicaid Other | Admitting: Licensed Clinical Social Worker

## 2023-05-07 ENCOUNTER — Ambulatory Visit: Payer: Medicaid Other | Admitting: Licensed Clinical Social Worker

## 2023-05-14 ENCOUNTER — Ambulatory Visit: Payer: Medicaid Other | Admitting: Licensed Clinical Social Worker

## 2023-05-15 ENCOUNTER — Ambulatory Visit: Payer: Medicaid Other | Admitting: Licensed Clinical Social Worker

## 2023-06-03 ENCOUNTER — Ambulatory Visit (INDEPENDENT_AMBULATORY_CARE_PROVIDER_SITE_OTHER): Payer: Medicaid Other

## 2023-06-03 VITALS — BP 135/93 | HR 80 | Resp 16 | Ht 66.0 in | Wt 205.0 lb

## 2023-06-03 DIAGNOSIS — Z3042 Encounter for surveillance of injectable contraceptive: Secondary | ICD-10-CM | POA: Diagnosis not present

## 2023-06-03 MED ORDER — MEDROXYPROGESTERONE ACETATE 150 MG/ML IM SUSP
150.0000 mg | Freq: Once | INTRAMUSCULAR | Status: AC
Start: 2023-06-03 — End: 2023-06-03
  Administered 2023-06-03: 150 mg via INTRAMUSCULAR

## 2023-06-03 NOTE — Patient Instructions (Signed)

## 2023-06-03 NOTE — Progress Notes (Signed)
    NURSE VISIT NOTE  Subjective:    Patient ID: Vanessa Francis, female    DOB: 1991-03-13, 33 y.o.   MRN: [id number]  HPI  Patient is a 33 y.o. G61P2103 female who presents for depo provera injection.   Objective:    BP (!) 135/93   Pulse 80   Resp 16   Ht 5\' 6"  (1.676 m)   Wt 205 lb (93 kg)   LMP  (LMP Unknown)   BMI 33.09 kg/m   Last Annual: Annual Exam Overdue. Patient aware. Will schedule appointment today Last pap: 07/17/2022. Last Depo-Provera: 03/11/2023. Side Effects if any: none. Serum HCG indicated? No . Depo-Provera 150 mg IM given by: Santiago Bumpers, CMA. Site: Left Deltoid  Lab Review   No labs at this visit.  Assessment:   1. Encounter for Depo-Provera contraception      Plan:   Next appointment due between May 13 and May 27.     Santiago Bumpers, CMA [facility]

## 2023-09-02 ENCOUNTER — Encounter: Payer: Self-pay | Admitting: Certified Nurse Midwife

## 2023-09-02 ENCOUNTER — Ambulatory Visit (INDEPENDENT_AMBULATORY_CARE_PROVIDER_SITE_OTHER): Payer: Medicaid Other | Admitting: Certified Nurse Midwife

## 2023-09-02 VITALS — BP 119/85 | HR 71 | Ht 66.0 in | Wt 217.0 lb

## 2023-09-02 DIAGNOSIS — Z0000 Encounter for general adult medical examination without abnormal findings: Secondary | ICD-10-CM

## 2023-09-02 DIAGNOSIS — Z3042 Encounter for surveillance of injectable contraceptive: Secondary | ICD-10-CM | POA: Diagnosis not present

## 2023-09-02 MED ORDER — MEDROXYPROGESTERONE ACETATE 150 MG/ML IM SUSY
150.0000 mg | PREFILLED_SYRINGE | Freq: Once | INTRAMUSCULAR | Status: AC
Start: 2023-09-02 — End: 2023-09-02
  Administered 2023-09-02: 150 mg via INTRAMUSCULAR

## 2023-09-02 NOTE — Patient Instructions (Signed)

## 2023-09-02 NOTE — Progress Notes (Signed)
 ANNUAL EXAM Patient name: Vanessa Francis MRN [id number]  Date of birth: 05-15-1990 Chief Complaint:   Annual Exam  History of Present Illness:   Vanessa Francis is a 33 y.o. [phone] African-American female being seen today for a routine annual exam.  Current complaints: none. Feeling well, tired due to caring for 24m old twins & older children. Working in childcare. Recently moved into larger home. No LMP recorded (lmp unknown). Irregular due to Depo injection.   Upstream - 09/02/23 2956       Pregnancy Intention Screening   Does the patient want to become pregnant in the next year? No    Does the patient's partner want to become pregnant in the next year? No    Would the patient like to discuss contraceptive options today? N/A   continue depo     Contraception Wrap Up   Current Method Hormonal Injection    End Method Hormonal Injection    Contraception Counseling Provided No    How was the end contraceptive method provided? Provided on site            The pregnancy intention screening data noted above was reviewed. Potential methods of contraception were discussed. The patient elected to proceed with Hormonal Injection.      Component Value Date/Time   DIAGPAP  07/17/2022 1033    - Negative for intraepithelial lesion or malignancy (NILM)   HPVHIGH Negative 07/17/2022 1033   ADEQPAP  07/17/2022 1033    Satisfactory for evaluation; transformation zone component PRESENT.       Last pap 2024. Results were: NILM w/ HRHPV negative. H/O abnormal pap: no Last mammogram: n/a. Results were: N/A. Family h/o breast cancer: no Last colonoscopy: n/a. Results were: N/A. Family h/o colorectal cancer: no     09/02/2023    9:04 AM 10/30/2020    4:09 PM 11/05/2019    2:07 PM  Depression screen PHQ 2/9  Decreased Interest 1 0 0  Down, Depressed, Hopeless 2 0 0  PHQ - 2 Score 3 0 0         No data to display         Past Medical History:  Diagnosis Date   H/O LEEP 2018  08/03/2020   History of abnormal cervical Pap smear 05/28/2016   05/16/16 ASCUS HPV +  08/2016 LEEP at [facility]  07/28/19 neg     Pregnancy affected by fetal growth restriction 10/31/2022   Supervision of high risk pregnancy, antepartum 09/10/2022              Clinical Staff    Provider      Office Location     [facility]    Dating     01/19/2023, by Ultrasound      Language     English    Anatomy US             Flu Vaccine          Genetic Screen     NIPS: Twins di-di/ Negative      TDaP vaccine      Declined    Hgb A1C or   GTT    Early :  Third trimester : 42 (07/25)      Covid              LAB RESULTS       Rhogam     B/Positive/-- (0   Supervision of other normal pregnancy, antepartum 07/10/2022  Clinical Staff  Provider  Office Location   [facility]  Dating   12wk US , mono-di twins   Language   English  Anatomy US       Flu Vaccine   offer  Genetic Screen   NIPS: neg  TDaP vaccine    offer  Hgb A1C or   GTT  Early :  Third trimester :   Covid  declined     LAB RESULTS   Rhogam   B/Positive/-- (04/03 1556)   Blood Type  B/Positive/-- (04/03 1556)   Feeding Plan  breast  Antibody   Thrombocytopenia (HCC)    Thrombocytopenia affecting pregnancy (HCC) 01/23/2015   Twin gestation, dichorionic diamniotic 09/10/2022   -F/u anatomy scan 09/11/22  -Serial growth scans at 24 weeks  -Delivery by 38 weeks     Vaginal Pap smear, abnormal    2018 or 2019   Family History  Problem Relation Age of Onset   Diabetes Mother    Hypertension Mother    Heart attack Mother    Cancer Father 54       liver   Anxiety disorder Sister    Diabetes Brother    Heart disease Maternal Grandmother    Hypertension Maternal Grandmother    Healthy Maternal Grandfather    Stroke Paternal Grandmother    Healthy Daughter    Autism Son      Review of Systems:   Pertinent items are noted in HPI Denies any headaches, blurred vision, fatigue, shortness of breath, chest pain, abdominal pain, abnormal vaginal  discharge/itching/odor/irritation, problems with periods, bowel movements, urination, or intercourse unless otherwise stated above. Pertinent History Reviewed:  Reviewed past medical,surgical, social and family history.  Reviewed problem list, medications and allergies. Physical Assessment:   Vitals:   09/02/23 0835  BP: 119/85  Pulse: 71  Weight: 217 lb (98.4 kg)  Height: 5\' 6"  (1.676 m)  Body mass index is 35.02 kg/m.       Physical Exam Vitals reviewed.  Constitutional:      General: She is not in acute distress.    Appearance: Normal appearance.  HENT:     Head: Normocephalic.  Neck:     Thyroid : No thyroid  mass or thyromegaly.  Cardiovascular:     Rate and Rhythm: Normal rate and regular rhythm.     Heart sounds: Normal heart sounds.  Pulmonary:     Effort: Pulmonary effort is normal.     Breath sounds: Normal breath sounds.  Chest:  Breasts:    Tanner Score is 5.     Right: Normal.     Left: Normal.  Abdominal:     General: Abdomen is flat.     Palpations: Abdomen is soft.     Tenderness: There is no abdominal tenderness.  Musculoskeletal:     Cervical back: Neck supple. No tenderness.  Lymphadenopathy:     Upper Body:     Right upper body: No axillary adenopathy.     Left upper body: No axillary adenopathy.  Skin:    General: Skin is warm and dry.  Neurological:     General: No focal deficit present.     Mental Status: She is alert and oriented to person, place, and time.  Psychiatric:        Mood and Affect: Mood normal.        Behavior: Behavior normal.      No results found for this or any previous visit (from the past 24 hours).  Assessment &  Plan:  1. Annual physical exam (Primary) - medroxyPROGESTERone  Acetate SUSY 150 mg  2. Encounter for Depo-Provera  contraception - medroxyPROGESTERone  Acetate SUSY 150 mg  Next depo due 8/12-12/16/23  Mammogram: @ 34yo, or sooner if problems Colonoscopy: @ 34yo, or sooner if problems  No orders of the  defined types were placed in this encounter.   Meds:  Meds ordered this encounter  Medications   medroxyPROGESTERone  Acetate SUSY 150 mg    Follow-up: Return in 1 year (on 09/01/2024) for Annual exam, next Depo 8/12-9/9.  Forestine Igo, CNM 09/02/2023 9:04 AM

## 2023-11-18 ENCOUNTER — Ambulatory Visit

## 2023-11-18 VITALS — BP 131/86 | HR 76 | Ht 66.0 in | Wt 217.0 lb

## 2023-11-18 DIAGNOSIS — Z3042 Encounter for surveillance of injectable contraceptive: Secondary | ICD-10-CM

## 2023-11-18 MED ORDER — MEDROXYPROGESTERONE ACETATE 150 MG/ML IM SUSY
150.0000 mg | PREFILLED_SYRINGE | Freq: Once | INTRAMUSCULAR | Status: AC
Start: 2023-11-18 — End: 2023-11-18
  Administered 2023-11-18 (×2): 150 mg via INTRAMUSCULAR

## 2023-11-18 NOTE — Progress Notes (Signed)
    NURSE VISIT NOTE  Subjective:    Patient ID: Vanessa Francis, female    DOB: Dec 30, 1990, 33 y.o.   MRN: [id number]  HPI  Patient is a 33 y.o. G72P2103 female who presents for depo provera  injection.   Objective:    BP 131/86   Pulse 76   Ht 5' 6 (1.676 m)   Wt 217 lb (98.4 kg)   LMP  (LMP Unknown)   Breastfeeding No   BMI 35.02 kg/m   Last Annual: 07/17/22. Last pap: 07/17/22. Last Depo-Provera : 09/02/22. Side Effects if any: none. Serum HCG indicated? No . Depo-Provera  150 mg IM given by: Annalee Sanders, CMA. Site: Right Deltoid  Assessment:   1. Encounter for Depo-Provera  contraception      Plan:   Next appointment due between oct-28 and nov-11.    Heyward Douthit H Roosevelt Bisher, CMA

## 2024-02-09 NOTE — Progress Notes (Unsigned)
    NURSE VISIT NOTE  Subjective:    Patient ID: Vanessa Francis, female    DOB: 29-May-1990, 33 y.o.   MRN: [id number]  HPI  Patient is a 33 y.o. G53P2103 female who presents for depo provera  injection.   Objective:    There were no vitals taken for this visit.  Last Annual: 07/17/2022. Last pap: 07/17/2022. Last Depo-Provera : 11/18/2023. Side Effects if any: none. Serum HCG indicated? No . Depo-Provera  150 mg IM given by: Camelia Fetters, CMA. Site: Left Deltoid  Lab Review  No results found for any visits on 02/10/24.  Assessment:   1. Encounter for Depo-Provera  contraception      Plan:   Next appointment due between Jan. 20 and Feb. 3    Camelia Fetters, CMA [facility]

## 2024-02-09 NOTE — Patient Instructions (Signed)

## 2024-02-10 ENCOUNTER — Ambulatory Visit

## 2024-02-10 ENCOUNTER — Ambulatory Visit (INDEPENDENT_AMBULATORY_CARE_PROVIDER_SITE_OTHER)

## 2024-02-10 VITALS — BP 133/80 | HR 87 | Resp 16 | Ht 66.0 in | Wt 219.5 lb

## 2024-02-10 DIAGNOSIS — Z3042 Encounter for surveillance of injectable contraceptive: Secondary | ICD-10-CM

## 2024-02-10 MED ORDER — MEDROXYPROGESTERONE ACETATE 150 MG/ML IM SUSP
150.0000 mg | Freq: Once | INTRAMUSCULAR | Status: AC
Start: 2024-02-10 — End: 2024-02-10
  Administered 2024-02-10: 150 mg via INTRAMUSCULAR

## 2024-04-26 NOTE — Progress Notes (Unsigned)
" ° ° °  NURSE VISIT NOTE  Subjective:    Patient ID: Vanessa Francis, female    DOB: [birth year], 34 y.o.   MRN: [id number]  HPI  Patient is a 34 y.o. G30P2103 female who presents for depo provera  injection.   Objective:    There were no vitals taken for this visit.  Last Annual: 09/02/23. Last pap: 07/17/22. Last Depo-Provera : 02/10/24. Side Effects if any: none. Serum HCG indicated? No . Depo-Provera  150 mg IM given by: Mathis Getting, CMA. Site: Right Deltoid  Lab Review  No results found for any visits on 04/27/24.  Assessment:   1. Encounter for management and injection of depo-Provera       Plan:   Next appointment due between 07/13/24 and 07/27/24.    Mathis LITTIE Getting, CMA  "

## 2024-04-27 ENCOUNTER — Ambulatory Visit (INDEPENDENT_AMBULATORY_CARE_PROVIDER_SITE_OTHER)

## 2024-04-27 VITALS — BP 131/100 | HR 82 | Ht 65.5 in | Wt 221.4 lb

## 2024-04-27 DIAGNOSIS — Z3042 Encounter for surveillance of injectable contraceptive: Secondary | ICD-10-CM

## 2024-04-27 MED ORDER — MEDROXYPROGESTERONE ACETATE 150 MG/ML IM SUSP
150.0000 mg | Freq: Once | INTRAMUSCULAR | Status: AC
  Administered 2024-04-27: 150 mg via INTRAMUSCULAR

## 2024-04-27 NOTE — Patient Instructions (Signed)

## 2024-07-16 ENCOUNTER — Ambulatory Visit
